# Patient Record
Sex: Male | Born: 1970
Health system: Southern US, Community
[De-identification: ages and names within clinical notes are randomized; demographics above are authoritative.]

## PROBLEM LIST (undated history)

## (undated) DIAGNOSIS — F419 Anxiety disorder, unspecified: Secondary | ICD-10-CM

## (undated) DIAGNOSIS — I10 Essential (primary) hypertension: Secondary | ICD-10-CM

## (undated) DIAGNOSIS — F32A Depression, unspecified: Secondary | ICD-10-CM

## (undated) DIAGNOSIS — F329 Major depressive disorder, single episode, unspecified: Secondary | ICD-10-CM

## (undated) DIAGNOSIS — Z85828 Personal history of other malignant neoplasm of skin: Secondary | ICD-10-CM

## (undated) DIAGNOSIS — F8081 Childhood onset fluency disorder: Secondary | ICD-10-CM

## (undated) DIAGNOSIS — R011 Cardiac murmur, unspecified: Secondary | ICD-10-CM

## (undated) DIAGNOSIS — F319 Bipolar disorder, unspecified: Secondary | ICD-10-CM

## (undated) HISTORY — DX: Anxiety disorder, unspecified: F41.9

## (undated) HISTORY — PX: OTHER SURGICAL HISTORY: SHX169

## (undated) HISTORY — PX: HERNIA REPAIR: SHX51

## (undated) HISTORY — DX: Personal history of other malignant neoplasm of skin: Z85.828

## (undated) HISTORY — DX: Bipolar disorder, unspecified: F31.9

## (undated) HISTORY — DX: Cardiac murmur, unspecified: R01.1

## (undated) HISTORY — DX: Childhood onset fluency disorder: F80.81

## (undated) HISTORY — DX: Depression, unspecified: F32.A

## (undated) HISTORY — DX: Essential (primary) hypertension: I10

## (undated) HISTORY — PX: COLONOSCOPY: SHX174

## (undated) HISTORY — PX: WRIST FRACTURE SURGERY: SHX121

## (undated) HISTORY — DX: Major depressive disorder, single episode, unspecified: F32.9

---

## 1998-06-22 ENCOUNTER — Encounter: Admission: RE | Admit: 1998-06-22 | Discharge: 1998-09-20 | Payer: Self-pay | Admitting: Internal Medicine

## 2004-04-22 ENCOUNTER — Ambulatory Visit: Payer: Self-pay | Admitting: Internal Medicine

## 2004-04-23 ENCOUNTER — Ambulatory Visit: Payer: Self-pay | Admitting: Internal Medicine

## 2004-04-30 ENCOUNTER — Ambulatory Visit: Payer: Self-pay | Admitting: Internal Medicine

## 2004-05-03 ENCOUNTER — Ambulatory Visit: Payer: Self-pay

## 2004-09-10 ENCOUNTER — Ambulatory Visit: Payer: Self-pay | Admitting: Internal Medicine

## 2004-11-05 ENCOUNTER — Ambulatory Visit: Payer: Self-pay | Admitting: Internal Medicine

## 2005-04-05 ENCOUNTER — Ambulatory Visit: Payer: Self-pay | Admitting: Internal Medicine

## 2005-05-26 ENCOUNTER — Ambulatory Visit: Payer: Self-pay | Admitting: Internal Medicine

## 2005-09-02 ENCOUNTER — Ambulatory Visit: Payer: Self-pay | Admitting: Internal Medicine

## 2005-09-23 ENCOUNTER — Ambulatory Visit: Payer: Self-pay | Admitting: Internal Medicine

## 2005-11-24 ENCOUNTER — Ambulatory Visit: Payer: Self-pay | Admitting: Internal Medicine

## 2006-05-22 ENCOUNTER — Ambulatory Visit: Payer: Self-pay | Admitting: Internal Medicine

## 2006-11-20 ENCOUNTER — Ambulatory Visit: Payer: Self-pay | Admitting: Internal Medicine

## 2006-11-20 DIAGNOSIS — Z85828 Personal history of other malignant neoplasm of skin: Secondary | ICD-10-CM

## 2006-11-20 DIAGNOSIS — L570 Actinic keratosis: Secondary | ICD-10-CM | POA: Insufficient documentation

## 2006-11-20 DIAGNOSIS — F329 Major depressive disorder, single episode, unspecified: Secondary | ICD-10-CM

## 2007-03-27 ENCOUNTER — Telehealth: Payer: Self-pay | Admitting: *Deleted

## 2007-03-28 ENCOUNTER — Ambulatory Visit: Payer: Self-pay | Admitting: Internal Medicine

## 2007-04-03 ENCOUNTER — Ambulatory Visit: Payer: Self-pay | Admitting: Licensed Clinical Social Worker

## 2007-04-10 ENCOUNTER — Ambulatory Visit: Payer: Self-pay | Admitting: Licensed Clinical Social Worker

## 2007-04-16 ENCOUNTER — Ambulatory Visit: Payer: Self-pay | Admitting: Licensed Clinical Social Worker

## 2007-04-23 ENCOUNTER — Ambulatory Visit: Payer: Self-pay | Admitting: Licensed Clinical Social Worker

## 2007-05-08 ENCOUNTER — Ambulatory Visit: Payer: Self-pay | Admitting: Internal Medicine

## 2007-05-08 DIAGNOSIS — R0683 Snoring: Secondary | ICD-10-CM | POA: Insufficient documentation

## 2007-07-21 ENCOUNTER — Ambulatory Visit (HOSPITAL_COMMUNITY): Admission: RE | Admit: 2007-07-21 | Discharge: 2007-07-21 | Payer: Self-pay | Admitting: Family Medicine

## 2007-07-21 ENCOUNTER — Emergency Department (HOSPITAL_COMMUNITY): Admission: EM | Admit: 2007-07-21 | Discharge: 2007-07-22 | Payer: Self-pay | Admitting: Family Medicine

## 2007-10-04 ENCOUNTER — Ambulatory Visit: Payer: Self-pay | Admitting: Internal Medicine

## 2007-10-26 ENCOUNTER — Ambulatory Visit: Payer: Self-pay | Admitting: Internal Medicine

## 2007-10-26 DIAGNOSIS — T887XXA Unspecified adverse effect of drug or medicament, initial encounter: Secondary | ICD-10-CM | POA: Insufficient documentation

## 2007-11-19 ENCOUNTER — Encounter: Payer: Self-pay | Admitting: Internal Medicine

## 2007-11-19 ENCOUNTER — Ambulatory Visit: Payer: Self-pay | Admitting: Internal Medicine

## 2007-12-21 ENCOUNTER — Telehealth: Payer: Self-pay | Admitting: Internal Medicine

## 2008-01-13 ENCOUNTER — Emergency Department (HOSPITAL_COMMUNITY): Admission: EM | Admit: 2008-01-13 | Discharge: 2008-01-13 | Payer: Self-pay | Admitting: Family Medicine

## 2008-06-20 ENCOUNTER — Encounter: Payer: Self-pay | Admitting: Internal Medicine

## 2008-06-20 ENCOUNTER — Ambulatory Visit: Payer: Self-pay | Admitting: Internal Medicine

## 2008-07-29 ENCOUNTER — Telehealth: Payer: Self-pay | Admitting: Internal Medicine

## 2008-07-29 ENCOUNTER — Ambulatory Visit: Payer: Self-pay | Admitting: Internal Medicine

## 2008-07-29 DIAGNOSIS — L255 Unspecified contact dermatitis due to plants, except food: Secondary | ICD-10-CM

## 2008-08-19 ENCOUNTER — Telehealth: Payer: Self-pay | Admitting: Internal Medicine

## 2008-09-22 ENCOUNTER — Ambulatory Visit: Payer: Self-pay | Admitting: Internal Medicine

## 2008-09-22 DIAGNOSIS — M19049 Primary osteoarthritis, unspecified hand: Secondary | ICD-10-CM | POA: Insufficient documentation

## 2008-10-15 ENCOUNTER — Telehealth: Payer: Self-pay | Admitting: Internal Medicine

## 2008-12-12 ENCOUNTER — Ambulatory Visit: Payer: Self-pay | Admitting: Family Medicine

## 2008-12-31 ENCOUNTER — Ambulatory Visit: Payer: Self-pay | Admitting: Internal Medicine

## 2008-12-31 ENCOUNTER — Encounter (INDEPENDENT_AMBULATORY_CARE_PROVIDER_SITE_OTHER): Payer: Self-pay | Admitting: *Deleted

## 2009-04-17 ENCOUNTER — Telehealth: Payer: Self-pay | Admitting: Internal Medicine

## 2009-05-21 ENCOUNTER — Ambulatory Visit: Payer: Self-pay | Admitting: Family Medicine

## 2009-05-21 DIAGNOSIS — J029 Acute pharyngitis, unspecified: Secondary | ICD-10-CM

## 2009-05-21 LAB — CONVERTED CEMR LAB: Rapid Strep: POSITIVE

## 2009-07-13 ENCOUNTER — Ambulatory Visit: Payer: Self-pay | Admitting: Internal Medicine

## 2009-08-10 ENCOUNTER — Ambulatory Visit: Payer: Self-pay | Admitting: Internal Medicine

## 2009-08-24 DIAGNOSIS — F985 Adult onset fluency disorder: Secondary | ICD-10-CM

## 2009-09-03 ENCOUNTER — Ambulatory Visit: Payer: Self-pay | Admitting: Internal Medicine

## 2009-09-03 LAB — CONVERTED CEMR LAB
ALT: 23 units/L (ref 0–53)
AST: 23 units/L (ref 0–37)
Albumin: 4 g/dL (ref 3.5–5.2)
Alkaline Phosphatase: 57 units/L (ref 39–117)
BUN: 11 mg/dL (ref 6–23)
Basophils Absolute: 0.1 10*3/uL (ref 0.0–0.1)
Basophils Relative: 1.2 % (ref 0.0–3.0)
Bilirubin Urine: NEGATIVE
Bilirubin, Direct: 0.2 mg/dL (ref 0.0–0.3)
Blood in Urine, dipstick: NEGATIVE
CO2: 30 meq/L (ref 19–32)
Calcium: 9.3 mg/dL (ref 8.4–10.5)
Chloride: 103 meq/L (ref 96–112)
Cholesterol: 149 mg/dL (ref 0–200)
Creatinine, Ser: 1 mg/dL (ref 0.4–1.5)
Eosinophils Absolute: 0.2 10*3/uL (ref 0.0–0.7)
Eosinophils Relative: 4.2 % (ref 0.0–5.0)
GFR calc non Af Amer: 88.25 mL/min (ref >60)
Glucose, Bld: 85 mg/dL (ref 70–99)
Glucose, Urine, Semiquant: NEGATIVE
HCT: 41.3 % (ref 39.0–52.0)
HDL: 35 mg/dL — ABNORMAL LOW (ref >39.00)
Hemoglobin: 14 g/dL (ref 13.0–17.0)
Ketones, urine, test strip: NEGATIVE
LDL Cholesterol: 95 mg/dL (ref 0–99)
Lymphocytes Relative: 34.9 % (ref 12.0–46.0)
Lymphs Abs: 1.8 10*3/uL (ref 0.7–4.0)
MCHC: 34 g/dL (ref 30.0–36.0)
MCV: 90.5 fL (ref 78.0–100.0)
Monocytes Absolute: 0.4 10*3/uL (ref 0.1–1.0)
Monocytes Relative: 7.9 % (ref 3.0–12.0)
Neutro Abs: 2.7 10*3/uL (ref 1.4–7.7)
Neutrophils Relative %: 51.8 % (ref 43.0–77.0)
Nitrite: NEGATIVE
Platelets: 173 10*3/uL (ref 150.0–400.0)
Potassium: 4.5 meq/L (ref 3.5–5.1)
RBC: 4.56 M/uL (ref 4.22–5.81)
RDW: 13 % (ref 11.5–14.6)
Sodium: 142 meq/L (ref 135–145)
Specific Gravity, Urine: >=1.03
TSH: 1.18 microintl units/mL (ref 0.35–5.50)
Total Bilirubin: 0.7 mg/dL (ref 0.3–1.2)
Total CHOL/HDL Ratio: 4
Total Protein: 7 g/dL (ref 6.0–8.3)
Triglycerides: 94 mg/dL (ref 0.0–149.0)
Urobilinogen, UA: 0.2
VLDL: 18.8 mg/dL (ref 0.0–40.0)
WBC Urine, dipstick: NEGATIVE
WBC: 5.2 10*3/uL (ref 4.5–10.5)
pH: 5.5

## 2009-09-14 ENCOUNTER — Ambulatory Visit: Payer: Self-pay | Admitting: Internal Medicine

## 2009-12-17 ENCOUNTER — Telehealth: Payer: Self-pay | Admitting: Internal Medicine

## 2010-01-25 ENCOUNTER — Telehealth: Payer: Self-pay | Admitting: Internal Medicine

## 2010-01-27 ENCOUNTER — Ambulatory Visit: Payer: Self-pay | Admitting: Internal Medicine

## 2010-01-27 DIAGNOSIS — R071 Chest pain on breathing: Secondary | ICD-10-CM

## 2010-03-15 ENCOUNTER — Other Ambulatory Visit: Payer: Self-pay | Admitting: Internal Medicine

## 2010-03-15 ENCOUNTER — Ambulatory Visit
Admission: RE | Admit: 2010-03-15 | Discharge: 2010-03-15 | Payer: Self-pay | Source: Home / Self Care | Attending: Internal Medicine | Admitting: Internal Medicine

## 2010-03-15 DIAGNOSIS — C44599 Other specified malignant neoplasm of skin of other part of trunk: Secondary | ICD-10-CM | POA: Insufficient documentation

## 2010-04-01 NOTE — Progress Notes (Signed)
Summary: samples please  Phone Note Call from Patient Call back at Home Phone [phone]   Caller: Patient Call For: Stacie Glaze MD Reason for Call: Insurance Question Summary of Call: pt needs pristiq samples Initial call taken by: Heron Sabins,  December 17, 2009 10:17 AM  Follow-up for Phone Call        left message on machine none avaiable- sent to [facility] Follow-up by: Willy Eddy, LPN,  December 17, 2009 11:38 AM    Prescriptions: PRISTIQ 100 MG XR24H-TAB (DESVENLAFAXINE SUCCINATE) one by mouth daily  #30 Each x 5   Entered by:   Willy Eddy, LPN   Authorized by:   Stacie Glaze MD   Signed by:   Willy Eddy, LPN on 09/81/1914   Method used:   Electronically to        [facility]  [phone]* (retail)       [address]       [place], [place]  [address]       Ph: [phone] or [phone]       Fax: [phone]   RxID:   [id number]

## 2010-04-01 NOTE — Assessment & Plan Note (Signed)
Summary: pt found lump in lft abdomen under ribs/per [place]/cjr   Vital Signs:  Patient profile:   40 year old male Height:      72 inches Weight:      218 pounds BMI:     29.67 Temp:     98.2 degrees F oral Pulse rate:   72 / minute Resp:     14 per minute BP sitting:   130 / 80  (left arm)  Vitals Entered By: Willy Eddy, LPN (January 27, 2010 3:54 PM) CC: c/o knot on left upper abd that pt states has increased in size- with frequent indigiestion- also c/o frequent ruptured blood vessels in eyes  Is Patient Diabetic? No   Primary Care Provider:  Stacie Glaze MD  CC:  c/o knot on left upper abd that pt states has increased in size- with frequent indigiestion- also c/o frequent ruptured blood vessels in eyes .  History of Present Illness: The pt has noted "heart burn" for several months For a week he has detected a knot in the area of the pain in the rib cage and the area is tender to touch The seroquil "knocks him out" has noted brocken blood vessel  in left eye  Preventive Screening-Counseling & Management  Alcohol-Tobacco     Smoking Status: never  Problems Prior to Update: 1)  Chest Wall Pain, Anterior  (ICD-786.52) 2)  Health Screening  (ICD-V70.0) 3)  Stuttering  (ICD-307.0) 4)  Sore Throat  (ICD-462) 5)  Osteoarthrosis Unspec Whether Gen/localized Hand  (ICD-715.94) 6)  Plant Dermatitis  (ICD-692.6) 7)  Uns Advrs Eff Uns Rx Medicinal&biological Sbstnc  (ICD-995.20) 8)  Neoplasm, Malignant, Skin, Trunk  (ICD-173.5) 9)  Snoring  (ICD-786.09) 10)  Actinic Keratosis, Forehead, Left  (ICD-702.0) 11)  Skin Cancer, Hx of  (ICD-V10.83) 12)  Depression  (ICD-311) 13)  Family History Diabetes 1st Degree Relative  (ICD-V18.0)  Current Problems (verified): 1)  Health Screening  (ICD-V70.0) 2)  Stuttering  (ICD-307.0) 3)  Sore Throat  (ICD-462) 4)  Osteoarthrosis Unspec Whether Gen/localized Hand  (ICD-715.94) 5)  Plant Dermatitis  (ICD-692.6) 6)  Uns Advrs  Eff Uns Rx Medicinal&biological Sbstnc  (ICD-995.20) 7)  Neoplasm, Malignant, Skin, Trunk  (ICD-173.5) 8)  Snoring  (ICD-786.09) 9)  Actinic Keratosis, Forehead, Left  (ICD-702.0) 10)  Skin Cancer, Hx of  (ICD-V10.83) 11)  Depression  (ICD-311) 12)  Family History Diabetes 1st Degree Relative  (ICD-V18.0)  Medications Prior to Update: 1)  Pristiq 100 Mg Xr24h-Tab (Desvenlafaxine Succinate) .... One By Mouth Daily 2)  Seroquel 25 Mg Tabs (Quetiapine Fumarate) .... One By Mouth Q Hs  Current Medications (verified): 1)  Pristiq 100 Mg Xr24h-Tab (Desvenlafaxine Succinate) .... One By Mouth Daily 2)  Mirtazapine 15 Mg Tabs (Mirtazapine) .... One By Mouth A Hs  Allergies (verified): No Known Drug Allergies  Past History:  Family History: Last updated: 11/20/2006 Family History High cholesterol Family History Hypertension hypercoagulable syndrome Family History Diabetes 1st degree relative  Social History: Last updated: 11/20/2006 Married Never Smoked  Risk Factors: Smoking Status: never (01/27/2010)  Past medical, surgical, family and social histories (including risk factors) reviewed, and no changes noted (except as noted below).  Past Medical History: Reviewed history from 11/20/2006 and no changes required. Depression Skin cancer, hx of  Past Surgical History: Reviewed history from 11/20/2006 and no changes required. mole bx fx to arm Inguinal herniorrhaphy as a child  Family History: Reviewed history from 11/20/2006 and no changes required. Family History  High cholesterol Family History Hypertension hypercoagulable syndrome Family History Diabetes 1st degree relative  Social History: Reviewed history from 11/20/2006 and no changes required. Married Never Smoked  Review of Systems       The patient complains of chest pain.  The patient denies anorexia, fever, weight loss, weight gain, vision loss, decreased hearing, hoarseness, syncope, dyspnea on exertion,  peripheral edema, prolonged cough, headaches, hemoptysis, abdominal pain, melena, hematochezia, severe indigestion/heartburn, hematuria, incontinence, genital sores, muscle weakness, suspicious skin lesions, transient blindness, difficulty walking, depression, unusual weight change, abnormal bleeding, enlarged lymph nodes, angioedema, and breast masses.    Physical Exam  General:  Well-developed,well-nourished,in no acute distress; alert,appropriate and cooperative throughout examination Head:  Normocephalic and atraumatic without obvious abnormalities. No apparent alopecia or balding. Ears:  R ear normal and L ear normal.   Nose:  no external deformity and no nasal discharge.   Mouth:  erythematous post pharynx without exudate. Neck:  minimal adenopathy. Lungs:  normal respiratory effort and no intercostal retractions.   Heart:  normal rate and regular rhythm.   Abdomen:  Bowel sounds positive,abdomen soft and non-tender without masses, organomegaly or hernias noted.   Impression & Recommendations:  Problem # 1:  CHEST WALL PAIN, ANTERIOR (ICD-786.52) point injection given Reviewed EKG (see interpretation) and treatment options. Patient instructed to call for worsening pain, or new symptoms.   Problem # 2:  UNS ADVRS EFF UNS RX MEDICINAL&BIOLOGICAL SBSTNC (ICD-995.20) effect of seroquil mandated stopping drug but still has anger issues  Problem # 3:  DEPRESSION (ICD-311)  His updated medication list for this problem includes:    Pristiq 100 Mg Xr24h-tab (Desvenlafaxine succinate) ..... One by mouth daily    Mirtazapine 15 Mg Tabs (Mirtazapine) ..... One by mouth a hs  Complete Medication List: 1)  Pristiq 100 Mg Xr24h-tab (Desvenlafaxine succinate) .... One by mouth daily 2)  Mirtazapine 15 Mg Tabs (Mirtazapine) .... One by mouth a hs  Patient Instructions: 1)  Please schedule a follow-up appointment in 3 months. Prescriptions: MIRTAZAPINE 15 MG TABS (MIRTAZAPINE) one by mouth a  HS  #30 x 3   Entered and Authorized by:   Stacie Glaze MD   Signed by:   Stacie Glaze MD on 01/27/2010   Method used:   Electronically to        [facility]  [phone]* (retail)       [address]       [place], [place]  [address]       Ph: [phone] or [phone]       Fax: [phone]   RxID:   [id number]    Orders Added: 1)  Est. Patient Level III [36644]

## 2010-04-01 NOTE — Assessment & Plan Note (Signed)
Summary: mole removal/cjr   Vital Signs:  Patient profile:   40 year old male Height:      72 inches Weight:      200 pounds BMI:     27.22 Temp:     98.6 degrees F oral Pulse rate:   72 / minute Resp:     14 per minute BP sitting:   110 / 70  (left arm)  Vitals Entered By: Willy Eddy, LPN (Jul 13, 2009 4:08 PM) CC: 6 month mole check   CC:  6 month mole check.  History of Present Illness: mole check and bx  Preventive Screening-Counseling & Management  Alcohol-Tobacco     Smoking Status: never  Current Problems (verified): 1)  Sore Throat  (ICD-462) 2)  Osteoarthrosis Unspec Whether Gen/localized Hand  (ICD-715.94) 3)  Plant Dermatitis  (ICD-692.6) 4)  Uns Advrs Eff Uns Rx Medicinal&biological Sbstnc  (ICD-995.20) 5)  Neoplasm, Malignant, Skin, Trunk  (ICD-173.5) 6)  Snoring  (ICD-786.09) 7)  Actinic Keratosis, Forehead, Left  (ICD-702.0) 8)  Skin Cancer, Hx of  (ICD-V10.83) 9)  Depression  (ICD-311) 10)  Family History Diabetes 1st Degree Relative  (ICD-V18.0)  Current Medications (verified): 1)  Pristiq 100 Mg Xr24h-Tab (Desvenlafaxine Succinate) .... One By Mouth Daily  Allergies (verified): No Known Drug Allergies  Physical Exam  Skin:  three leson on back consistant with prior neoplasm   Impression & Recommendations:  Problem # 1:  NEOPLASM, MALIGNANT, SKIN, TRUNK (ICD-173.5)  pt was preped in a sterile manor and informed consent obtained. Using a 15 blade three lesions were removed and sent for pathology. sterile dressings were applied  and wound care discussed with the pt. upper mid and lower back  Orders: No Charge Patient Arrived (NCPA0) (NCPA0) Excise Malig Lesion (TAL) 0 - 0.5 cm (11600)  Problem # 2:  DEPRESSION (ICD-311)  with anxiety and anger issues add seroguil 25 myg by mouth q hs His updated medication list for this problem includes:    Pristiq 100 Mg Xr24h-tab (Desvenlafaxine succinate) ..... One by mouth  daily  Discussed treatment options, including trial of antidpressant medication. Will refer to behavioral health. Follow-up call in in 24-48 hours and recheck in 2 weeks, sooner as needed. Patient agrees to call if any worsening of symptoms or thoughts of doing harm arise. Verified that the patient has no suicidal ideation at this time.   Complete Medication List: 1)  Pristiq 100 Mg Xr24h-tab (Desvenlafaxine succinate) .... One by mouth daily 2)  Seroquel 25 Mg Tabs (Quetiapine fumarate) .... One by mouth q hs  Patient Instructions: 1)  Please schedule a follow-up appointment in 1 month. Prescriptions: SEROQUEL 25 MG TABS (QUETIAPINE FUMARATE) one by mouth q HS  #30 x 6   Entered and Authorized by:   Stacie Glaze MD   Signed by:   Stacie Glaze MD on 07/13/2009   Method used:   Electronically to        [facility]  [phone]* (retail)       [address]       [place], [place]  [address]       Ph: [phone] or [phone]       Fax: [phone]   RxID:   [id number]

## 2010-04-01 NOTE — Progress Notes (Signed)
Summary: samples  Phone Note Call from Patient   Caller: Patient Call For: Stacie Glaze MD Reason for Call: Acute Illness Complaint: Urinary/GYN Problems Summary of Call: calling for samples of pristiq [phone]- [phone] Initial call taken by: Raechel Ache, RN,  April 17, 2009 1:48 PM  Follow-up for Phone Call        samples out front-left message on machine  Follow-up by: Willy Eddy, LPN,  April 17, 2009 4:50 PM

## 2010-04-01 NOTE — Progress Notes (Signed)
Summary: Pt req work in appt with Dr Lovell Sheehan only this week  Phone Note Call from Patient Call back at [facility] Phone [phone]   Caller: Patient Reason for Call: Acute Illness Summary of Call: Pt found a lump lft side up under pts ribs. Pt said that it is tender to touch. Pt req work in appt asap with Dr. Lovell Sheehan only, for sometime this week.  Initial call taken by: Lucy Antigua,  January 25, 2010 11:38 AM  Follow-up for Phone Call        may have the 3:30 block on wednesday- thanks-please call pt Follow-up by: Willy Eddy, LPN,  January 25, 2010 11:49 AM  Additional Follow-up for Phone Call Additional follow up Details #1::        I called pt and sch ov for 3:30pm on Wed 01/27/10, as noted above.  Additional Follow-up by: Lucy Antigua,  January 25, 2010 2:21 PM

## 2010-04-01 NOTE — Assessment & Plan Note (Signed)
Summary: SORE THROAT (CONCERNED ABOUT STREP) // RS   Vital Signs:  Patient profile:   40 year old male Temp:     98.7 degrees F oral BP sitting:   110 / 80  (left arm) Cuff size:   regular  Vitals Entered By: Sid Falcon LPN (May 21, 2009 4:39 PM)  History of Present Illness: Sore throat for 4-5 days.  One of his children had strep last week. ?low grade fever monday but none since. Denies nasal cong, current fever, cough.  Pos body aches and intermittent mild headaches. No alleviating factors.  No exacerbating factors.  Also mildly pruritic rash L forearm and hand.   NKDA.  Allergies (verified): No Known Drug Allergies  Past History:  Past Medical History: Last updated: 11/20/2006 Depression Skin cancer, hx of PMH reviewed for relevance  Review of Systems      See HPI  Physical Exam  General:  Well-developed,well-nourished,in no acute distress; alert,appropriate and cooperative throughout examination Head:  Normocephalic and atraumatic without obvious abnormalities. No apparent alopecia or balding. Ears:  External ear exam shows no significant lesions or deformities.  Otoscopic examination reveals clear canals, tympanic membranes are intact bilaterally without bulging, retraction, inflammation or discharge. Hearing is grossly normal bilaterally. Mouth:  erythematous post pharynx without exudate. Neck:  minimal adenopathy. Lungs:  Normal respiratory effort, chest expands symmetrically. Lungs are clear to auscultation, no crackles or wheezes. Heart:  Normal rate and regular rhythm. S1 and S2 normal without gallop, murmur, click, rub or other extra sounds. Skin:  slightly raised rash L forearm with mild erythema/vesicular   Impression & Recommendations:  Problem # 1:  SORE THROAT (ICD-462) strep pos.  Start Amoxicillin. The following medications were removed from the medication list:    Amoxicillin-pot Clavulanate 875-125 Mg Tabs (Amoxicillin-pot clavulanate)  ..... One by mouth two times a day for 10 days His updated medication list for this problem includes:    Amoxicillin 875 Mg Tabs (Amoxicillin) ..... One by mouth two times a day for 10 days  Orders: Rapid Strep ([address])  Problem # 2:  PLANT DERMATITIS (ICD-692.6) mild case. pt will use topicals and try to avoid prednisone at this time with concommitent  bacterial illness.  Complete Medication List: 1)  Pristiq 100 Mg Xr24h-tab (Desvenlafaxine succinate) .... One by mouth daily 2)  Amoxicillin 875 Mg Tabs (Amoxicillin) .... One by mouth two times a day for 10 days  Patient Instructions: 1)  Tylenol or Advil for symptom relief of sore throat. Prescriptions: AMOXICILLIN 875 MG TABS (AMOXICILLIN) one by mouth two times a day for 10 days  #20 x 0   Entered and Authorized by:   Evelena Peat MD   Signed by:   Evelena Peat MD on 05/21/2009   Method used:   Print then Give to Patient   RxID:   [id number]   Laboratory Results  Date/Time Received: May 21, 2009 4:48 PM  Date/Time Reported: May 21, 2009 4:48 PM   Other Tests  Rapid Strep: positive Comments Wynona Canes, CMA  May 21, 2009 4:49 PM

## 2010-04-01 NOTE — Assessment & Plan Note (Signed)
Summary: cpx/cjr   Vital Signs:  Patient profile:   40 year old male Height:      72 inches Weight:      216 pounds BMI:     29.40 Temp:     98.2 degrees F oral Pulse rate:   72 / minute Resp:     14 per minute BP sitting:   124 / 80  (left arm)  Vitals Entered By: Willy Eddy, LPN (September 14, 2009 3:17 PM)  Nutrition Counseling: Patient's BMI is greater than 25 and therefore counseled on weight management options. CC: cpx Is Patient Diabetic? No Pain Assessment Patient in pain? no        CC:  cpx.  History of Present Illness: The pt was asked about all immunizations, health maint. services that are appropriate to their age and was given guidance on diet exercize  and weight management   Preventive Screening-Counseling & Management  Alcohol-Tobacco     Smoking Status: never  Problems Prior to Update: 1)  Stuttering  (ICD-307.0) 2)  Sore Throat  (ICD-462) 3)  Osteoarthrosis Unspec Whether Gen/localized Hand  (ICD-715.94) 4)  Plant Dermatitis  (ICD-692.6) 5)  Uns Advrs Eff Uns Rx Medicinal&biological Sbstnc  (ICD-995.20) 6)  Neoplasm, Malignant, Skin, Trunk  (ICD-173.5) 7)  Snoring  (ICD-786.09) 8)  Actinic Keratosis, Forehead, Left  (ICD-702.0) 9)  Skin Cancer, Hx of  (ICD-V10.83) 10)  Depression  (ICD-311) 11)  Family History Diabetes 1st Degree Relative  (ICD-V18.0)  Current Problems (verified): 1)  Stuttering  (ICD-307.0) 2)  Sore Throat  (ICD-462) 3)  Osteoarthrosis Unspec Whether Gen/localized Hand  (ICD-715.94) 4)  Plant Dermatitis  (ICD-692.6) 5)  Uns Advrs Eff Uns Rx Medicinal&biological Sbstnc  (ICD-995.20) 6)  Neoplasm, Malignant, Skin, Trunk  (ICD-173.5) 7)  Snoring  (ICD-786.09) 8)  Actinic Keratosis, Forehead, Left  (ICD-702.0) 9)  Skin Cancer, Hx of  (ICD-V10.83) 10)  Depression  (ICD-311) 11)  Family History Diabetes 1st Degree Relative  (ICD-V18.0)  Medications Prior to Update: 1)  Pristiq 100 Mg Xr24h-Tab (Desvenlafaxine Succinate)  .... One By Mouth Daily 2)  Seroquel 25 Mg Tabs (Quetiapine Fumarate) .... One By Mouth Q Hs  Current Medications (verified): 1)  Pristiq 100 Mg Xr24h-Tab (Desvenlafaxine Succinate) .... One By Mouth Daily 2)  Seroquel 25 Mg Tabs (Quetiapine Fumarate) .... One By Mouth Q Hs  Allergies (verified): No Known Drug Allergies  Past History:  Family History: Last updated: 11/20/2006 Family History High cholesterol Family History Hypertension hypercoagulable syndrome Family History Diabetes 1st degree relative  Social History: Last updated: 11/20/2006 Married Never Smoked  Risk Factors: Smoking Status: never (09/14/2009)  Past medical, surgical, family and social histories (including risk factors) reviewed, and no changes noted (except as noted below).  Past Medical History: Reviewed history from 11/20/2006 and no changes required. Depression Skin cancer, hx of  Past Surgical History: Reviewed history from 11/20/2006 and no changes required. mole bx fx to arm Inguinal herniorrhaphy as a child  Family History: Reviewed history from 11/20/2006 and no changes required. Family History High cholesterol Family History Hypertension hypercoagulable syndrome Family History Diabetes 1st degree relative  Social History: Reviewed history from 11/20/2006 and no changes required. Married Never Smoked  Review of Systems  The patient denies anorexia, fever, weight loss, weight gain, vision loss, decreased hearing, hoarseness, chest pain, syncope, dyspnea on exertion, peripheral edema, prolonged cough, headaches, hemoptysis, abdominal pain, melena, hematochezia, severe indigestion/heartburn, hematuria, incontinence, genital sores, muscle weakness, suspicious skin lesions, transient blindness, difficulty walking,  depression, unusual weight change, abnormal bleeding, enlarged lymph nodes, angioedema, and breast masses.    Physical Exam  General:  Well-developed,well-nourished,in no  acute distress; alert,appropriate and cooperative throughout examination Head:  Normocephalic and atraumatic without obvious abnormalities. No apparent alopecia or balding. Ears:  R ear normal and L ear normal.   Nose:  no external deformity and no nasal discharge.   Neck:  minimal adenopathy. Chest Wall:  No deformities, masses, tenderness or gynecomastia noted. Breasts:  No masses or gynecomastia noted Lungs:  normal respiratory effort and no intercostal retractions.   Heart:  normal rate and regular rhythm.   Abdomen:  Bowel sounds positive,abdomen soft and non-tender without masses, organomegaly or hernias noted. Rectal:  no external abnormalities and no masses.   Genitalia:  uncircumcised and no urethral discharge.   Prostate:  no gland enlargement and no nodules.     Impression & Recommendations:  Problem # 1:  HEALTH SCREENING (ICD-V70.0)  Orders: normal  EKG w/ Interpretation (93000)  Td Booster: Tdap (11/20/2006)   Chol: 149 (09/03/2009)   HDL: 35.00 (09/03/2009)   LDL: 95 (09/03/2009)   TG: 94.0 (09/03/2009) TSH: 1.18 (09/03/2009)    Discussed using sunscreen, use of alcohol, drug use, self testicular exam, routine dental care, routine eye care, routine physical exam, seat belts, multiple vitamins, osteoporosis prevention, adequate calcium intake in diet, and recommendations for immunizations.  Discussed exercise and checking cholesterol.  Discussed gun safety, safe sex, and contraception. Also recommend checking PSA.  Complete Medication List: 1)  Pristiq 100 Mg Xr24h-tab (Desvenlafaxine succinate) .... One by mouth daily 2)  Seroquel 25 Mg Tabs (Quetiapine fumarate) .... One by mouth q hs  Patient Instructions: 1)  Please schedule a follow-up appointment in 6 months. mole check

## 2010-04-01 NOTE — Assessment & Plan Note (Signed)
Summary: 1 month rov/njr   Vital Signs:  Patient profile:   40 year old male Height:      72 inches Weight:      216 pounds BMI:     29.40 Temp:     98.2 degrees F oral Pulse rate:   72 / minute Resp:     14 per minute BP sitting:   110 / 70  (left arm)  Vitals Entered By: Willy Eddy, LPN (August 10, 2009 4:02 PM)              CC: roa-seroquel makes him "groggy"   CC:  roa-seroquel makes him "groggy".  History of Present Illness: the seroguil helps but it makes him "groggy" the medication is helping him sleep    so we recommended to take closer to bed time control of the anger is better mood aroung kids is  is in school studdering is stable  Preventive Screening-Counseling & Management  Alcohol-Tobacco     Smoking Status: never  Problems Prior to Update: 1)  Sore Throat  (ICD-462) 2)  Osteoarthrosis Unspec Whether Gen/localized Hand  (ICD-715.94) 3)  Plant Dermatitis  (ICD-692.6) 4)  Uns Advrs Eff Uns Rx Medicinal&biological Sbstnc  (ICD-995.20) 5)  Neoplasm, Malignant, Skin, Trunk  (ICD-173.5) 6)  Snoring  (ICD-786.09) 7)  Actinic Keratosis, Forehead, Left  (ICD-702.0) 8)  Skin Cancer, Hx of  (ICD-V10.83) 9)  Depression  (ICD-311) 10)  Family History Diabetes 1st Degree Relative  (ICD-V18.0)  Current Problems (verified): 1)  Sore Throat  (ICD-462) 2)  Osteoarthrosis Unspec Whether Gen/localized Hand  (ICD-715.94) 3)  Plant Dermatitis  (ICD-692.6) 4)  Uns Advrs Eff Uns Rx Medicinal&biological Sbstnc  (ICD-995.20) 5)  Neoplasm, Malignant, Skin, Trunk  (ICD-173.5) 6)  Snoring  (ICD-786.09) 7)  Actinic Keratosis, Forehead, Left  (ICD-702.0) 8)  Skin Cancer, Hx of  (ICD-V10.83) 9)  Depression  (ICD-311) 10)  Family History Diabetes 1st Degree Relative  (ICD-V18.0)  Medications Prior to Update: 1)  Pristiq 100 Mg Xr24h-Tab (Desvenlafaxine Succinate) .... One By Mouth Daily 2)  Seroquel 25 Mg Tabs (Quetiapine Fumarate) .... One By Mouth Q  Hs  Current Medications (verified): 1)  Pristiq 100 Mg Xr24h-Tab (Desvenlafaxine Succinate) .... One By Mouth Daily 2)  Seroquel 25 Mg Tabs (Quetiapine Fumarate) .... One By Mouth Q Hs  Allergies (verified): No Known Drug Allergies  Past History:  Family History: Last updated: 11/20/2006 Family History High cholesterol Family History Hypertension hypercoagulable syndrome Family History Diabetes 1st degree relative  Social History: Last updated: 11/20/2006 Married Never Smoked  Risk Factors: Smoking Status: never (08/10/2009)  Past medical, surgical, family and social histories (including risk factors) reviewed, and no changes noted (except as noted below).  Past Medical History: Reviewed history from 11/20/2006 and no changes required. Depression Skin cancer, hx of  Past Surgical History: Reviewed history from 11/20/2006 and no changes required. mole bx fx to arm Inguinal herniorrhaphy as a child  Family History: Reviewed history from 11/20/2006 and no changes required. Family History High cholesterol Family History Hypertension hypercoagulable syndrome Family History Diabetes 1st degree relative  Social History: Reviewed history from 11/20/2006 and no changes required. Married Never Smoked  Review of Systems  The patient denies anorexia, fever, weight loss, weight gain, vision loss, decreased hearing, hoarseness, chest pain, syncope, dyspnea on exertion, peripheral edema, prolonged cough, headaches, hemoptysis, abdominal pain, melena, hematochezia, severe indigestion/heartburn, hematuria, incontinence, genital sores, muscle weakness, suspicious skin lesions, transient blindness, difficulty walking, depression, unusual weight change, abnormal  bleeding, enlarged lymph nodes, angioedema, breast masses, and testicular masses.    Physical Exam  General:  Well-developed,well-nourished,in no acute distress; alert,appropriate and cooperative throughout  examination Head:  Normocephalic and atraumatic without obvious abnormalities. No apparent alopecia or balding. Ears:  R ear normal and L ear normal.   Nose:  no external deformity and no nasal discharge.   Neck:  minimal adenopathy. Lungs:  normal respiratory effort and no intercostal retractions.   Heart:  normal rate and regular rhythm.   Abdomen:  Bowel sounds positive,abdomen soft and non-tender without masses, organomegaly or hernias noted. Msk:  joint swelling.  cyst in hand Neurologic:  alert & oriented X3.   Psych:  good eye contact, not anxious appearing, and subdued.     Impression & Recommendations:  Problem # 1:  DEPRESSION (ICD-311)  His updated medication list for this problem includes:    Pristiq 100 Mg Xr24h-tab (Desvenlafaxine succinate) ..... One by mouth daily  Discussed treatment options, including trial of antidpressant medication. Will refer to behavioral health. Follow-up call in in 24-48 hours and recheck in 2 weeks, sooner as needed. Patient agrees to call if any worsening of symptoms or thoughts of doing harm arise. Verified that the patient has no suicidal ideation at this time.   Problem # 2:  UNS ADVRS EFF UNS RX MEDICINAL&BIOLOGICAL SBSTNC (ICD-995.20) stable reaction to the seroquil  Problem # 3:  STUTTERING (ICD-307.0) working with speech therapy and control of anxiety  Complete Medication List: 1)  Pristiq 100 Mg Xr24h-tab (Desvenlafaxine succinate) .... One by mouth daily 2)  Seroquel 25 Mg Tabs (Quetiapine fumarate) .... One by mouth q hs  Patient Instructions: 1)  Please schedule a follow-up appointment in 3 months.

## 2010-04-07 NOTE — Assessment & Plan Note (Signed)
Summary: mole check/njr   Vital Signs:  Patient profile:   40 year old male Height:      72 inches Weight:      218 pounds BMI:     29.67 Temp:     98.1 degrees F oral Pulse rate:   72 / minute Resp:     14 per minute BP sitting:   130 / 80  (left arm)  Vitals Entered By: Willy Eddy, LPN (March 15, 2010 2:48 PM) CC: 6 month mole check Is Patient Diabetic? No   Primary Care Provider:  Stacie Glaze MD  CC:  6 month mole check.  History of Present Illness: skin check and two  moles found on prior physical examination one a site of prior premalignamt lesions on his low back   Preventive Screening-Counseling & Management  Alcohol-Tobacco     Smoking Status: never  Current Problems (verified): 1)  Chest Wall Pain, Anterior  (ICD-786.52) 2)  Health Screening  (ICD-V70.0) 3)  Stuttering  (ICD-307.0) 4)  Sore Throat  (ICD-462) 5)  Osteoarthrosis Unspec Whether Gen/localized Hand  (ICD-715.94) 6)  Plant Dermatitis  (ICD-692.6) 7)  Uns Advrs Eff Uns Rx Medicinal&biological Sbstnc  (ICD-995.20) 8)  Neoplasm, Malignant, Skin, Trunk  (ICD-173.5) 9)  Snoring  (ICD-786.09) 10)  Actinic Keratosis, Forehead, Left  (ICD-702.0) 11)  Skin Cancer, Hx of  (ICD-V10.83) 12)  Depression  (ICD-311) 13)  Family History Diabetes 1st Degree Relative  (ICD-V18.0)  Current Medications (verified): 1)  Pristiq 100 Mg Xr24h-Tab (Desvenlafaxine Succinate) .... One By Mouth Daily 2)  Mirtazapine 15 Mg Tabs (Mirtazapine) .... One By Mouth A Hs  Allergies (verified): No Known Drug Allergies   Impression & Recommendations:  Problem # 1:  OTH SPEC MALIG NEOPLASM SKIN TRUNK NO SCROTUM (ICD-173.59)  2 mol were identified on his low back and one area of previous excision was noted to have repeat pigmented growth on the mid low back all 3 lesions were removed using  the procedure  below pt was prepped in a sterile manor and informed consent obtained. Using a 15 blade the lesion was removed  and sent for pathology. sterile dressings were applied  and wound care discussed with the pt.  Orders: No Charge Patient Arrived (NCPA0) (NCPA0) Excise lesion (TAL) 0 - 0.5 cm (11400)  Complete Medication List: 1)  Pristiq 100 Mg Xr24h-tab (Desvenlafaxine succinate) .... One by mouth daily 2)  Mirtazapine 15 Mg Tabs (Mirtazapine) .... One by mouth a hs  Patient Instructions: 1)  Please schedule a follow-up appointment in 3 months.   Orders Added: 1)  No Charge Patient Arrived (NCPA0) [NCPA0] 2)  Excise lesion (TAL) 0 - 0.5 cm [11400]

## 2010-06-10 ENCOUNTER — Encounter: Payer: Self-pay | Admitting: Internal Medicine

## 2010-06-18 ENCOUNTER — Ambulatory Visit: Payer: Self-pay | Admitting: Internal Medicine

## 2010-07-05 ENCOUNTER — Telehealth: Payer: Self-pay | Admitting: *Deleted

## 2010-07-05 NOTE — Telephone Encounter (Signed)
Pt only wants to speak to Dr. Lovell Sheehan. LMTCB

## 2010-07-05 NOTE — Telephone Encounter (Signed)
Per dr Temple Pacini to go to d r Emerson Monte for testing- pt informed and name and telephone number given

## 2010-07-05 NOTE — Telephone Encounter (Signed)
Pt thinks he is bipolar.  Would like to talk to Dr. Lovell Sheehan or have an appt.  The Pristiq and Remeron does not seem to be working.

## 2010-07-15 ENCOUNTER — Telehealth: Payer: Self-pay | Admitting: *Deleted

## 2010-07-15 NOTE — Telephone Encounter (Signed)
Pt is asking for Pristique samples.

## 2010-07-19 NOTE — Telephone Encounter (Signed)
Ready for pick up

## 2010-07-20 NOTE — Telephone Encounter (Signed)
Left message samples are ready for pick up.

## 2010-08-09 ENCOUNTER — Telehealth: Payer: Self-pay | Admitting: *Deleted

## 2010-08-09 MED ORDER — PREDNISONE (PAK) 10 MG PO TABS: 10.0000 mg | ORAL_TABLET | Freq: Every day | ORAL | Status: AC

## 2010-08-09 NOTE — Telephone Encounter (Signed)
Meds sent in to pharmacy per previous notes that pt has poison ivy and Dr. Lovell Sheehan sent meds to pharmacy.  See previous note

## 2010-08-09 NOTE — Telephone Encounter (Signed)
Per dr Lovell Sheehan- may have prednisone 10 mg 12 day dose pack

## 2010-08-09 NOTE — Telephone Encounter (Signed)
Needs meds for poison ivy.

## 2010-09-17 ENCOUNTER — Encounter: Payer: Self-pay | Admitting: Internal Medicine

## 2010-09-17 ENCOUNTER — Ambulatory Visit (INDEPENDENT_AMBULATORY_CARE_PROVIDER_SITE_OTHER): Payer: 59 | Admitting: Internal Medicine

## 2010-09-17 VITALS — BP 120/80 | Temp 98.4°F | Wt 227.0 lb

## 2010-09-17 DIAGNOSIS — M545 Low back pain, unspecified: Secondary | ICD-10-CM

## 2010-09-17 MED ORDER — TRAMADOL HCL 50 MG PO TABS: 50.0000 mg | ORAL_TABLET | Freq: Four times a day (QID) | ORAL | Status: DC | PRN

## 2010-09-17 MED ORDER — METHYLPREDNISOLONE ACETATE 80 MG/ML IJ SUSP
80.0000 mg | Freq: Once | INTRAMUSCULAR | Status: AC
  Administered 2010-09-17: 80 mg via INTRAMUSCULAR

## 2010-09-17 NOTE — Progress Notes (Signed)
  Subjective:    Patient ID: Caleb Clayton, male    DOB: 12-01-1970, 40 y.o.   MRN: [id number]  HPI  40 year old patient who is seen today with a five-day history of pain in the lumbar back region. He states that he has had similar pain in 2009. Pain is described as a spasm. There may be some slight radiation to the right upper outer leg. No motor weakness. Denies any constitutional complaints. He also complains of a mild sore throat    Review of Systems  Musculoskeletal: Positive for back pain.       Objective:   Physical Exam  Constitutional: He appears well-developed and well-nourished. No distress.  HENT:  Mouth/Throat: Oropharynx is clear and moist.  Musculoskeletal: Normal range of motion.       Negative straight leg test No motor weakness Achilles reflexes normal          Assessment & Plan:   Low back pain. We'll treat with Depo-Medrol and clinically observed;  we'll give a prescription for tramadol as needed

## 2010-09-17 NOTE — Patient Instructions (Signed)
Most patients with low back pain will improve with time over the next two to 6 weeks.  Keep active but avoid any activities that cause pain.  Apply moist heat to the low back area several times daily.  Take 400-600 mg of ibuprofen ( Advil, Motrin) with food every 4 to 6 hours as needed for pain relief or control of fever

## 2010-10-01 ENCOUNTER — Ambulatory Visit (INDEPENDENT_AMBULATORY_CARE_PROVIDER_SITE_OTHER): Payer: 59 | Admitting: Internal Medicine

## 2010-10-01 VITALS — BP 130/80 | HR 72 | Temp 98.2°F | Resp 16 | Ht 72.0 in | Wt 224.0 lb

## 2010-10-01 DIAGNOSIS — D239 Other benign neoplasm of skin, unspecified: Secondary | ICD-10-CM

## 2010-10-01 DIAGNOSIS — D229 Melanocytic nevi, unspecified: Secondary | ICD-10-CM

## 2010-10-01 MED ORDER — PHENTERMINE HCL 37.5 MG PO CAPS: 37.5000 mg | ORAL_CAPSULE | ORAL | Status: DC

## 2010-10-01 MED ORDER — PHENTERMINE HCL 37.5 MG PO CAPS: 37.5000 mg | ORAL_CAPSULE | ORAL | Status: AC

## 2010-10-01 NOTE — Patient Instructions (Signed)
Take Prilosec over-the-counter one daily until you see me in 2 months I have given you a prescription for Adipex-P 37.5 mg to take first thing in the morning to suppress her appetite all day

## 2010-11-02 ENCOUNTER — Encounter: Payer: Self-pay | Admitting: Internal Medicine

## 2010-11-02 NOTE — Progress Notes (Signed)
  Subjective:    Patient ID: Caleb Clayton, male    DOB: 04/06/1970, 40 y.o.   MRN: [id number]  HPI This is a [age]-year-old white male presents for multiple medical problems including depression history of actinic keratosis and history of precancerous lesions which she is monitoring of his skin every 6 months and multiple biopsies.  He denies any new lesions that he has detected a presents for a complete skin examination.     Review of Systems  Constitutional: Negative for fever and fatigue.  HENT: Negative for hearing loss, congestion, neck pain and postnasal drip.   Eyes: Negative for discharge, redness and visual disturbance.  Respiratory: Negative for cough, shortness of breath and wheezing.   Cardiovascular: Negative for leg swelling.  Gastrointestinal: Negative for abdominal pain, constipation and abdominal distention.  Genitourinary: Negative for urgency and frequency.  Musculoskeletal: Negative for joint swelling and arthralgias.  Skin: Negative for color change and rash.  Neurological: Negative for weakness and light-headedness.  Hematological: Negative for adenopathy.  Psychiatric/Behavioral: Negative for behavioral problems.       Objective:   Physical Exam  Nursing note and vitals reviewed. Constitutional: He appears well-developed and well-nourished.  HENT:  Head: Normocephalic and atraumatic.  Eyes: Conjunctivae are normal. Pupils are equal, round, and reactive to light.  Neck: Normal range of motion. Neck supple.  Cardiovascular: Normal rate and regular rhythm.   Pulmonary/Chest: Effort normal and breath sounds normal.  Abdominal: Soft. Bowel sounds are normal.  Skin:       2 lesions identified that are suspicious for neoplasm          Assessment & Plan:  Patient was prepped in a sterile manner and using shave biopsy technique 2 lesions were removed and the patient sent for pathology.  Subsequent pathology revealed the lesions to be dysplastic but the  margins were free he should have continued 6 month surveillance

## 2010-11-12 ENCOUNTER — Telehealth: Payer: Self-pay | Admitting: *Deleted

## 2010-11-12 MED ORDER — HYDROCORTISONE 2.5 % RE CREA: TOPICAL_CREAM | RECTAL | Status: DC

## 2010-11-12 NOTE — Telephone Encounter (Signed)
Spoke with pt - informed of rx sent to pharmacy and other instructions

## 2010-11-12 NOTE — Telephone Encounter (Signed)
Please call in a prescription for Anusol-HC cream;  suggest high fiber diet liberal fluid intake and a stool softener

## 2010-11-12 NOTE — Telephone Encounter (Signed)
Pt is complaining of hemorrhoids and constipation x one week.

## 2010-12-01 ENCOUNTER — Ambulatory Visit (INDEPENDENT_AMBULATORY_CARE_PROVIDER_SITE_OTHER): Payer: 59 | Admitting: Internal Medicine

## 2010-12-01 VITALS — BP 136/80 | HR 76 | Temp 98.2°F | Resp 16 | Ht 72.0 in | Wt 216.0 lb

## 2010-12-01 DIAGNOSIS — T887XXA Unspecified adverse effect of drug or medicament, initial encounter: Secondary | ICD-10-CM

## 2010-12-01 DIAGNOSIS — T753XXA Motion sickness, initial encounter: Secondary | ICD-10-CM

## 2010-12-01 DIAGNOSIS — M6740 Ganglion, unspecified site: Secondary | ICD-10-CM

## 2010-12-01 DIAGNOSIS — M67439 Ganglion, unspecified wrist: Secondary | ICD-10-CM

## 2010-12-01 MED ORDER — SCOPOLAMINE 1 MG/3DAYS TD PT72: 1.0000 | MEDICATED_PATCH | TRANSDERMAL | Status: DC

## 2011-02-28 ENCOUNTER — Encounter: Payer: Self-pay | Admitting: Internal Medicine

## 2011-02-28 NOTE — Progress Notes (Signed)
  Subjective:    Patient ID: Caleb Clayton, male    DOB: Jan 14, 1971, 40 y.o.   MRN: [id number]  HPI Patient presents today for monitoring of the medications used to control depression.  He also has a ganglion cyst on his hand that he wants evaluated for treatment. He has now been on a combination of Lipitor Depakote with good response his depression.  He is requesting a prescription for Transderm scopolamine for a planned trip.   Review of Systems  Constitutional: Negative for fever and fatigue.  HENT: Negative for hearing loss, congestion, neck pain and postnasal drip.   Eyes: Negative for discharge, redness and visual disturbance.  Respiratory: Negative for cough, shortness of breath and wheezing.   Cardiovascular: Negative for leg swelling.  Gastrointestinal: Negative for abdominal pain, constipation and abdominal distention.  Genitourinary: Negative for urgency and frequency.  Musculoskeletal: Negative for joint swelling and arthralgias.  Skin: Negative for color change and rash.  Neurological: Negative for weakness and light-headedness.  Hematological: Negative for adenopathy.  Psychiatric/Behavioral: Negative for behavioral problems.       Objective:   Physical Exam  Constitutional: He appears well-developed and well-nourished.  HENT:  Head: Normocephalic and atraumatic.  Eyes: Conjunctivae are normal. Pupils are equal, round, and reactive to light.  Neck: Normal range of motion. Neck supple.  Cardiovascular: Normal rate and regular rhythm.   Pulmonary/Chest: Effort normal and breath sounds normal.  Abdominal: Soft. Bowel sounds are normal.  Musculoskeletal:       Ganglion cyst          Assessment & Plan:  Aspirated ganglion cyst at no charge Reviewed her medications and their effect and side effect he is tolerating the combination well we encouraged him to continue the medications as prescribed the Depakote level should be obtained Prescription for scopolamine  was given for plantar

## 2011-03-04 ENCOUNTER — Encounter: Payer: Self-pay | Admitting: Internal Medicine

## 2011-03-04 ENCOUNTER — Ambulatory Visit (INDEPENDENT_AMBULATORY_CARE_PROVIDER_SITE_OTHER): Payer: 59 | Admitting: Internal Medicine

## 2011-03-04 VITALS — BP 136/88 | HR 76 | Temp 98.2°F | Resp 16 | Ht 72.0 in | Wt 226.0 lb

## 2011-03-04 DIAGNOSIS — R071 Chest pain on breathing: Secondary | ICD-10-CM

## 2011-03-04 DIAGNOSIS — R0789 Other chest pain: Secondary | ICD-10-CM

## 2011-03-04 DIAGNOSIS — F8081 Childhood onset fluency disorder: Secondary | ICD-10-CM

## 2011-03-04 DIAGNOSIS — T887XXA Unspecified adverse effect of drug or medicament, initial encounter: Secondary | ICD-10-CM

## 2011-03-04 NOTE — Patient Instructions (Signed)
The patient is instructed to continue all medications as prescribed. Schedule followup with check out clerk upon leaving the clinic  

## 2011-03-04 NOTE — Progress Notes (Signed)
Subjective:    Patient ID: Caleb Clayton, male    DOB: [birth year], 41 y.o.   MRN: [id number]  HPI This is a 41 year old white male who presents for followup of stuttering and for atypical depression on a combination of 2 seizure medications. He requires a Depakote level today it has been 6 months since his last Depakote level.  He reports no side effects from the medications other than weight gain.  Due to the weight gain he has some abdominal pain along the edge of the anterior ribs more pronounced on the left than the right no GI symptoms no pulmonary symptoms no exertional pain symptoms.      Review of Systems  Constitutional: Negative for fever and fatigue.  HENT: Negative for hearing loss, congestion, neck pain and postnasal drip.   Eyes: Negative for discharge, redness and visual disturbance.  Respiratory: Negative for cough, shortness of breath and wheezing.   Cardiovascular: Negative for leg swelling.  Gastrointestinal: Negative for abdominal pain, constipation and abdominal distention.  Genitourinary: Negative for urgency and frequency.  Musculoskeletal: Negative for joint swelling and arthralgias.  Skin: Negative for color change and rash.  Neurological: Negative for weakness and light-headedness.  Hematological: Negative for adenopathy.  Psychiatric/Behavioral: Negative for behavioral problems.   Past Medical History  Diagnosis Date  . Depression   . History of skin cancer   . Stutter     History   Social History  . Marital Status: Married    Spouse Name: N/A    Number of Children: N/A  . Years of Education: N/A   Occupational History  . Not on file.   Social History Main Topics  . Smoking status: Never Smoker   . Smokeless tobacco: Never Used  . Alcohol Use: No  . Drug Use: No  . Sexually Active: Not on file   Other Topics Concern  . Not on file   Social History Narrative  . No narrative on file    Past Surgical History  Procedure Date  .  Mole bx   . Arm fx   . Abdominal hysterectomy     Family History  Problem Relation Age of Onset  . Hyperlipidemia    . Hypertension    . Depression      No Known Allergies  Current Outpatient Prescriptions on File Prior to Visit  Medication Sig Dispense Refill  . divalproex (DEPAKOTE) 500 MG EC tablet Take 1,500 mg by mouth daily.       Marland Kitchen lamoTRIgine (LAMICTAL) 25 MG tablet Take 100 mg by mouth daily.       Marland Kitchen LORazepam (ATIVAN) 0.5 MG tablet Take 0.5 mg by mouth every 8 (eight) hours as needed.          BP 136/88  Pulse 76  Temp 98.2 F (36.8 C)  Resp 16  Ht 6' (1.829 m)  Wt 226 lb (102.513 kg)  BMI 30.65 kg/m2       Objective:   Physical Exam  Nursing note and vitals reviewed. Constitutional: He appears well-developed and well-nourished.  HENT:  Head: Normocephalic and atraumatic.  Eyes: Conjunctivae are normal. Pupils are equal, round, and reactive to light.  Neck: Normal range of motion. Neck supple.  Cardiovascular: Normal rate and regular rhythm.   Pulmonary/Chest: Effort normal and breath sounds normal. He exhibits tenderness.  Abdominal: Soft. Bowel sounds are normal. There is tenderness.          Assessment & Plan:  Depakote level drawn today and sent  to Dr. Emerson Monte for monitoring of his antidepressant therapy.  No side effects noted.  Chronic stuttering also treated with seizure medications.  Abdominal pain chest wall pain left anterior chest lower floating ribs secondary to weight gain and  distention

## 2011-04-28 ENCOUNTER — Telehealth: Payer: Self-pay | Admitting: *Deleted

## 2011-04-28 MED ORDER — TOBRAMYCIN-DEXAMETHASONE 0.3-0.1 % OP SUSP: 2.0000 [drp] | OPHTHALMIC | Status: AC

## 2011-04-28 NOTE — Telephone Encounter (Signed)
Notified pt. 

## 2011-04-28 NOTE — Telephone Encounter (Signed)
Per dr Lovell Sheehan- tobradex 2 drops in affected eye every 4 hours while awake for 7 days

## 2011-04-28 NOTE — Telephone Encounter (Signed)
Pt. States he has a huge sty on his left eye that has now turned his entire red, and looks like pink eye now.

## 2011-06-03 ENCOUNTER — Ambulatory Visit: Payer: 59 | Admitting: Internal Medicine

## 2011-07-28 ENCOUNTER — Encounter: Payer: Self-pay | Admitting: Internal Medicine

## 2011-07-28 ENCOUNTER — Ambulatory Visit (INDEPENDENT_AMBULATORY_CARE_PROVIDER_SITE_OTHER): Payer: 59 | Admitting: Internal Medicine

## 2011-07-28 VITALS — BP 140/80 | HR 64 | Temp 98.0°F | Resp 16 | Ht 72.0 in | Wt 225.0 lb

## 2011-07-28 DIAGNOSIS — C44509 Unspecified malignant neoplasm of skin of other part of trunk: Secondary | ICD-10-CM

## 2011-07-28 DIAGNOSIS — C44599 Other specified malignant neoplasm of skin of other part of trunk: Secondary | ICD-10-CM

## 2011-07-28 NOTE — Patient Instructions (Signed)
Skin Cancer Most skin cancers occur on the face, neck, arms, or on sun-exposed areas. Damage from excessive sunlight exposure is the most common cause of skin cancer. People with light skin are more likely to develop skin cancer than people with more skin pigment, but skin cancers occur in people of all races. Squamous and basal cell cancers are the most common types. They are both slow-growing and easy to recognize. They often form scaly open sores that do not heal. Melanoma is a skin cancer that looks like a mole. Most moles are harmless, especially if they do not change over time. Most skin cancers can be prevented by avoiding excessive sunlight exposure. Signs that a skin sore or mole may be cancerous include:  A sore that does not heal or one that bleeds or slowly gets bigger.   Moles that are growing or are larger than a pencil eraser, or ones that have irregular borders or color variations.   Moles that have a bump, a raised area, or cause itching or pain.  If you have a skin sore or mole that could be cancer, it is important that you call your doctor right away for a skin exam and possible skin biopsy to determine the best treatment. Document Released: 03/24/2004 Document Revised: 02/03/2011 Document Reviewed: 07/04/2008 [facility] Patient Information 2012 [place], [place].

## 2011-07-28 NOTE — Progress Notes (Signed)
  Subjective:    Patient ID: Caleb Clayton, male    DOB: [birth year], 41 y.o.   MRN: [id number]  HPI This is a 41 year old male with a strong family history of skin cancer who presents for his six-month screening he has a history of actinic keratosis P. basAL cell lesions on back and shoulders he HAS a long history of sun exposure.     Review of Systems  Constitutional: Negative for fever and fatigue.  HENT: Negative for hearing loss, congestion, neck pain and postnasal drip.   Eyes: Negative for discharge, redness and visual disturbance.  Respiratory: Negative for cough, shortness of breath and wheezing.   Cardiovascular: Negative for leg swelling.  Gastrointestinal: Negative for abdominal pain, constipation and abdominal distention.  Genitourinary: Negative for urgency and frequency.  Musculoskeletal: Negative for joint swelling and arthralgias.  Skin: Negative for color change and rash.  Neurological: Negative for weakness and light-headedness.  Hematological: Negative for adenopathy.  Psychiatric/Behavioral: Negative for behavioral problems.       Objective:   Physical Exam  Nursing note and vitals reviewed. Constitutional: He appears well-developed and well-nourished.  HENT:  Head: Normocephalic and atraumatic.  Eyes: Conjunctivae are normal. Pupils are equal, round, and reactive to light.  Neck: Normal range of motion. Neck supple.  Cardiovascular: Normal rate and regular rhythm.   Pulmonary/Chest: Effort normal and breath sounds normal.  Abdominal: Soft. Bowel sounds are normal.  Skin:       2 suspicious lesions identified one on the upper mid back one on the middle mid back          Assessment & Plan:  Skin survey identified 2 possible shave biopsies for basal cell changes and possible pre-melanotic lesions  Informed consent was given by the patient for a shave biopsy. The site was prepped with Betadine and using a 15 blade a 1 cm shave biopsy was obtained from  the upper back this is specimen #1. The specimin was placed in preservative and sent for pathology. Hemostasis was achieved with a compression. Wound care was discussed with the patient. The patient was informed that it would be one to 2 weeks before the pathology will be interpreted. Informed consent was given by the patient for a shave biopsy. The site was prepped with Betadine and using a 15 blade a 1 cm shave biopsy was obtained from the middle back this is specimen #2. The specimin was placed in preservative and sent for pathology. Hemostasis was achieved with a compression. Wound care was discussed with the patient. The patient was informed that it would be one to 2 weeks before the pathology will be interpreted.

## 2011-07-28 NOTE — Progress Notes (Signed)
Addended by: Willy Eddy on: 07/28/2011 12:56 PM   Modules accepted: Orders

## 2011-08-05 ENCOUNTER — Telehealth: Payer: Self-pay | Admitting: *Deleted

## 2011-08-05 NOTE — Telephone Encounter (Signed)
Left message on machine Per dr Lovell Sheehan- bp may be up because he has gained some weight- watch what he eats and cut back on salt- monitor and let us know what has been over the weekend

## 2011-08-05 NOTE — Telephone Encounter (Signed)
Pt called stating bp has been slight elevated this am- 150/100- Left message on machine To watch salt and monitor.

## 2012-01-30 ENCOUNTER — Encounter: Payer: 59 | Admitting: Internal Medicine

## 2012-01-30 DIAGNOSIS — Z0289 Encounter for other administrative examinations: Secondary | ICD-10-CM

## 2012-03-02 ENCOUNTER — Emergency Department (HOSPITAL_BASED_OUTPATIENT_CLINIC_OR_DEPARTMENT_OTHER): Payer: 59

## 2012-03-02 ENCOUNTER — Encounter (HOSPITAL_BASED_OUTPATIENT_CLINIC_OR_DEPARTMENT_OTHER): Payer: Self-pay | Admitting: *Deleted

## 2012-03-02 ENCOUNTER — Emergency Department (HOSPITAL_BASED_OUTPATIENT_CLINIC_OR_DEPARTMENT_OTHER)
Admission: EM | Admit: 2012-03-02 | Discharge: 2012-03-03 | Disposition: A | Discharge status: Home / Self Care | Payer: 59 | Attending: Emergency Medicine | Admitting: Emergency Medicine

## 2012-03-02 DIAGNOSIS — F329 Major depressive disorder, single episode, unspecified: Secondary | ICD-10-CM | POA: Insufficient documentation

## 2012-03-02 DIAGNOSIS — Y9351 Activity, roller skating (inline) and skateboarding: Secondary | ICD-10-CM | POA: Insufficient documentation

## 2012-03-02 DIAGNOSIS — S59919A Unspecified injury of unspecified forearm, initial encounter: Secondary | ICD-10-CM | POA: Insufficient documentation

## 2012-03-02 DIAGNOSIS — S6990XA Unspecified injury of unspecified wrist, hand and finger(s), initial encounter: Secondary | ICD-10-CM | POA: Insufficient documentation

## 2012-03-02 DIAGNOSIS — M79602 Pain in left arm: Secondary | ICD-10-CM

## 2012-03-02 DIAGNOSIS — F8089 Other developmental disorders of speech and language: Secondary | ICD-10-CM | POA: Insufficient documentation

## 2012-03-02 DIAGNOSIS — S4980XA Other specified injuries of shoulder and upper arm, unspecified arm, initial encounter: Secondary | ICD-10-CM | POA: Insufficient documentation

## 2012-03-02 DIAGNOSIS — S59909A Unspecified injury of unspecified elbow, initial encounter: Secondary | ICD-10-CM | POA: Insufficient documentation

## 2012-03-02 DIAGNOSIS — F3289 Other specified depressive episodes: Secondary | ICD-10-CM | POA: Insufficient documentation

## 2012-03-02 DIAGNOSIS — W19XXXA Unspecified fall, initial encounter: Secondary | ICD-10-CM

## 2012-03-02 DIAGNOSIS — S46909A Unspecified injury of unspecified muscle, fascia and tendon at shoulder and upper arm level, unspecified arm, initial encounter: Secondary | ICD-10-CM | POA: Insufficient documentation

## 2012-03-02 DIAGNOSIS — Y929 Unspecified place or not applicable: Secondary | ICD-10-CM | POA: Insufficient documentation

## 2012-03-02 DIAGNOSIS — Z79899 Other long term (current) drug therapy: Secondary | ICD-10-CM | POA: Insufficient documentation

## 2012-03-02 DIAGNOSIS — Z85828 Personal history of other malignant neoplasm of skin: Secondary | ICD-10-CM | POA: Insufficient documentation

## 2012-03-02 NOTE — ED Notes (Signed)
Left elbow, shoulder, upper arm and neck pain after he fell while skating tonight.

## 2012-03-03 ENCOUNTER — Emergency Department (HOSPITAL_BASED_OUTPATIENT_CLINIC_OR_DEPARTMENT_OTHER): Payer: 59

## 2012-03-03 NOTE — ED Provider Notes (Signed)
History     CSN: [id number]  Arrival date & time 03/02/12  2243   First MD Initiated Contact with Patient 03/03/12 0012      Chief Complaint  Patient presents with  . Arm Pain    (Consider location/radiation/quality/duration/timing/severity/associated sxs/prior treatment) The history is provided by the patient.  Caleb Clayton is a 42 y.o. male here with L arm pain. He was skate boarding tonight and fell on L arm. + L shoulder and L forearm pain afterwards. No head injury. No other injuries. Has hx of L forearm fracture s/p pins.    Past Medical History  Diagnosis Date  . Depression   . History of skin cancer   . Stutter     Past Surgical History  Procedure Date  . Mole bx   . Arm fx     Family History  Problem Relation Age of Onset  . Hyperlipidemia    . Hypertension    . Depression      History  Substance Use Topics  . Smoking status: Never Smoker   . Smokeless tobacco: Never Used  . Alcohol Use: No      Review of Systems  Musculoskeletal:       L arm pain   All other systems reviewed and are negative.    Allergies  Review of patient's allergies indicates no known allergies.  Home Medications   Current Outpatient Rx  Name  Route  Sig  Dispense  Refill  . DIVALPROEX SODIUM 500 MG PO TBEC   Oral   Take 1,500 mg by mouth daily.          Marland Kitchen LAMOTRIGINE 25 MG PO TABS   Oral   Take 100 mg by mouth daily.          Marland Kitchen LORAZEPAM 0.5 MG PO TABS   Oral   Take 0.5 mg by mouth every 8 (eight) hours as needed.             BP 131/80  Pulse 77  Temp 97.5 F (36.4 C) (Oral)  Resp 20  SpO2 99%  Physical Exam  Nursing note and vitals reviewed. Constitutional: He is oriented to person, place, and time. He appears well-developed and well-nourished.  HENT:  Head: Normocephalic and atraumatic.  Mouth/Throat: Oropharynx is clear and moist.  Eyes: Conjunctivae normal are normal. Pupils are equal, round, and reactive to light.  Neck: Normal range  of motion. Neck supple.       No midline tenderness   Cardiovascular: Normal rate, regular rhythm and normal heart sounds.   Pulmonary/Chest: Effort normal and breath sounds normal.  Abdominal: Soft. Bowel sounds are normal.  Musculoskeletal: Normal range of motion.       L forearm no obvious deformity. Mild tenderness proximal radius. NL elbow ROM. NL wrist ROM. 2+ pulses.   Neurological: He is alert and oriented to person, place, and time.  Skin: Skin is warm and dry.  Psychiatric: He has a normal mood and affect. His behavior is normal. Judgment and thought content normal.    ED Course  Procedures (including critical care time)  Labs Reviewed - No data to display Dg Forearm Left  03/03/2012  *RADIOLOGY REPORT*  Clinical Data: Proximal forearm pain post fall  LEFT FOREARM - 2 VIEW  Comparison: None  Findings: Osseous mineralization normal. Wrist and elbow joint alignments grossly normal. No acute fracture, dislocation or bone destruction.  IMPRESSION: No acute osseous abnormalities.   Original Report Authenticated By: Ulyses Southward,  M.D.    Dg Humerus Left  03/03/2012  *RADIOLOGY REPORT*  Clinical Data: Distal left upper arm pain following a fall.  LEFT HUMERUS - 2+ VIEW  Comparison: None.  Findings: Normal appearing bones and soft tissues without fracture or dislocation.  IMPRESSION: Normal examination.   Original Report Authenticated By: Beckie Salts, M.D.      No diagnosis found.    MDM  Caleb Clayton is a 42 y.o. male here with L forearm pain. Xrays showed no fractures. He declined pain meds. I offered a splint but he declined. He will f/u with his orthopedic doctor.         Richardean Canal, MD 03/03/12 [phone]

## 2012-03-11 NOTE — Progress Notes (Signed)
  Subjective:    Patient ID: Caleb Clayton, male    DOB: Mar 25, 1970, 42 y.o.   MRN: [id number]  HPI Opened in error Pt moved Not a no show   Review of Systems     Objective:   Physical Exam        Assessment & Plan:  oped in erro

## 2012-05-09 ENCOUNTER — Ambulatory Visit (INDEPENDENT_AMBULATORY_CARE_PROVIDER_SITE_OTHER): Payer: 59 | Admitting: Internal Medicine

## 2012-05-09 ENCOUNTER — Encounter: Payer: Self-pay | Admitting: Internal Medicine

## 2012-05-09 VITALS — BP 120/84 | HR 72 | Temp 98.6°F | Resp 16 | Ht 72.0 in | Wt 225.0 lb

## 2012-05-09 DIAGNOSIS — R635 Abnormal weight gain: Secondary | ICD-10-CM

## 2012-05-09 DIAGNOSIS — C44599 Other specified malignant neoplasm of skin of other part of trunk: Secondary | ICD-10-CM

## 2012-05-09 MED ORDER — LORAZEPAM 0.5 MG PO TABS: 0.5000 mg | ORAL_TABLET | Freq: Three times a day (TID) | ORAL | Status: DC | PRN

## 2012-05-09 NOTE — Addendum Note (Signed)
Addended by: Willy Eddy on: 05/09/2012 04:17 PM   Modules accepted: Orders

## 2012-05-09 NOTE — Patient Instructions (Signed)
Discuss the change of the lamictal to topamax 50 mg for weight loss

## 2012-05-09 NOTE — Progress Notes (Signed)
  Subjective:    Patient ID: Caleb Clayton, male    DOB: 1971-02-18, 42 y.o.   MRN: [id number]  HPI Increased anxiety Weight gain and need to appetitive suppressant diet   Review of Systems  Constitutional: Negative for fever and fatigue.  HENT: Negative for hearing loss, congestion, neck pain and postnasal drip.   Eyes: Negative for discharge, redness and visual disturbance.  Respiratory: Negative for cough, shortness of breath and wheezing.   Cardiovascular: Negative for leg swelling.  Gastrointestinal: Negative for abdominal pain, constipation and abdominal distention.  Genitourinary: Negative for urgency and frequency.  Musculoskeletal: Negative for joint swelling and arthralgias.  Skin: Negative for color change and rash.  Neurological: Negative for weakness and light-headedness.  Hematological: Negative for adenopathy.  Psychiatric/Behavioral: Negative for behavioral problems.       Objective:   Physical Exam  Constitutional: He appears well-developed and well-nourished.  HENT:  Head: Normocephalic and atraumatic.  Eyes: Conjunctivae are normal. Pupils are equal, round, and reactive to light.  Neck: Normal range of motion. Neck supple.  Cardiovascular: Normal rate and regular rhythm.   Pulmonary/Chest: Effort normal and breath sounds normal.  Abdominal: Soft. Bowel sounds are normal.          Assessment & Plan:  reexcision of previously biopsied mole on lower back to 2 regrowth of pigmented lesion New lesion identified on upper right shoulder  Weight gain noted due to medications Discussed psych to change lamictal?  Informed consent was given by the patient for a shave biopsy. The site (s) was prepped with Betadine and using a 15 blade a 1 cm shave biopsy was obtained. The specimin was placed in preservative and sent for pathology. Hemostasis was achieved with a compression. Wound care was discussed with the patient. The patient was informed that it would be one  to 2 weeks before the pathology will be interpreted.

## 2012-10-10 ENCOUNTER — Encounter: Payer: Self-pay | Admitting: Internal Medicine

## 2012-11-09 ENCOUNTER — Ambulatory Visit: Payer: 59 | Admitting: Internal Medicine

## 2012-11-28 ENCOUNTER — Encounter: Payer: Self-pay | Admitting: Internal Medicine

## 2012-11-28 ENCOUNTER — Ambulatory Visit (INDEPENDENT_AMBULATORY_CARE_PROVIDER_SITE_OTHER): Payer: 59 | Admitting: Internal Medicine

## 2012-11-28 VITALS — BP 130/80 | HR 76 | Temp 98.6°F | Resp 16 | Ht 72.0 in | Wt 235.0 lb

## 2012-11-28 DIAGNOSIS — D239 Other benign neoplasm of skin, unspecified: Secondary | ICD-10-CM

## 2012-11-28 DIAGNOSIS — Z85828 Personal history of other malignant neoplasm of skin: Secondary | ICD-10-CM

## 2012-11-28 DIAGNOSIS — D229 Melanocytic nevi, unspecified: Secondary | ICD-10-CM

## 2012-11-28 NOTE — Addendum Note (Signed)
Addended by: Willy Eddy on: 11/28/2012 04:49 PM   Modules accepted: Orders

## 2012-11-28 NOTE — Progress Notes (Signed)
  Subjective:    Patient ID: Caleb Clayton, male    DOB: September 29, 1970, 42 y.o.   MRN: [id number]  HPI Patient presents for a mole check with a history of premalignant and pre- melanoma lesions. Therethere was one suspicious lesion in the low back that was prepared for excision. A combination of his medicine and his diet has resulted in weight gain   Review of Systems  Constitutional: Negative for fever and fatigue.  HENT: Negative for hearing loss, congestion, neck pain and postnasal drip.   Eyes: Negative for discharge, redness and visual disturbance.  Respiratory: Negative for cough, shortness of breath and wheezing.   Cardiovascular: Negative for leg swelling.  Gastrointestinal: Negative for abdominal pain, constipation and abdominal distention.  Genitourinary: Negative for urgency and frequency.  Musculoskeletal: Negative for joint swelling and arthralgias.  Skin: Negative for color change and rash.  Neurological: Negative for weakness and light-headedness.  Hematological: Negative for adenopathy.  Psychiatric/Behavioral: Negative for behavioral problems.       Objective:   Physical Exam  HENT:  Head: Normocephalic.  Eyes: Conjunctivae are normal. Pupils are equal, round, and reactive to light.  Pulmonary/Chest: Effort normal and breath sounds normal.  Abdominal: Soft. Bowel sounds are normal.  Skin:  Lesion on mid lower back          Assessment & Plan:  Weight gain as a side effect of the psychiatric medicine.  Recommend gluten-free diet for weight loss.  Identified a mole on the mid lower back site was prepped with Betadine and using a #15 blade the site was excised and sent for pathology

## 2013-03-25 ENCOUNTER — Other Ambulatory Visit: Payer: Self-pay | Admitting: Internal Medicine

## 2013-06-10 ENCOUNTER — Ambulatory Visit: Payer: 59 | Admitting: Internal Medicine

## 2013-10-28 ENCOUNTER — Ambulatory Visit (INDEPENDENT_AMBULATORY_CARE_PROVIDER_SITE_OTHER): Payer: 59 | Admitting: Physician Assistant

## 2013-10-28 ENCOUNTER — Encounter: Payer: Self-pay | Admitting: Physician Assistant

## 2013-10-28 VITALS — BP 100/76 | HR 96 | Temp 100.7°F | Resp 18 | Wt 233.1 lb

## 2013-10-28 DIAGNOSIS — J029 Acute pharyngitis, unspecified: Secondary | ICD-10-CM

## 2013-10-28 LAB — POCT RAPID STREP A (OFFICE): RAPID STREP A SCREEN: NEGATIVE

## 2013-10-28 MED ORDER — AMOXICILLIN 500 MG PO CAPS: 500.0000 mg | ORAL_CAPSULE | Freq: Two times a day (BID) | ORAL | Status: DC

## 2013-10-28 NOTE — Progress Notes (Signed)
Subjective:    Patient ID: Caleb Clayton, male    DOB: Oct 19, 1970, 43 y.o.   MRN: [id number]  Sore Throat  This is a new problem. The current episode started in the past 7 days (3 days). The problem has been gradually worsening. Neither side of throat is experiencing more pain than the other. The maximum temperature recorded prior to his arrival was 100 - 100.9 F. The fever has been present for 1 to 2 days. The pain is at a severity of 8/10. The pain is severe. Associated symptoms include congestion, coughing (occasionally, dry, from throat irritation. ), headaches, a hoarse voice, swollen glands and trouble swallowing. Pertinent negatives include no abdominal pain, diarrhea, drooling, ear discharge, ear pain, plugged ear sensation, neck pain, shortness of breath, stridor or vomiting. He has had exposure to strep (daughter had Strep throat 1 to 2 weeks ago, treated with abx.). He has had no exposure to mono. He has tried acetaminophen (robutussin) for the symptoms.      Review of Systems  Constitutional: Positive for fever and chills.  HENT: Positive for congestion, hoarse voice, postnasal drip, sore throat and trouble swallowing. Negative for drooling, ear discharge and ear pain.   Respiratory: Positive for cough (occasionally, dry, from throat irritation. ). Negative for shortness of breath and stridor.   Cardiovascular: Negative for chest pain.  Gastrointestinal: Positive for nausea. Negative for vomiting, abdominal pain and diarrhea.  Musculoskeletal: Negative for neck pain.  Neurological: Positive for headaches. Negative for syncope.  All other systems reviewed and are negative.    Past Medical History  Diagnosis Date  . Depression   . History of skin cancer   . Stutter     History   Social History  . Marital Status: Married    Spouse Name: N/A    Number of Children: N/A  . Years of Education: N/A   Occupational History  . Not on file.   Social History Main Topics  .  Smoking status: Never Smoker   . Smokeless tobacco: Never Used  . Alcohol Use: No  . Drug Use: No  . Sexual Activity: Not on file   Other Topics Concern  . Not on file   Social History Narrative  . No narrative on file    Past Surgical History  Procedure Laterality Date  . Mole bx    . Arm fx      Family History  Problem Relation Age of Onset  . Hyperlipidemia    . Hypertension    . Depression      No Known Allergies  Current Outpatient Prescriptions on File Prior to Visit  Medication Sig Dispense Refill  . divalproex (DEPAKOTE) 500 MG EC tablet Take 1,500 mg by mouth daily.       Marland Kitchen lamoTRIgine (LAMICTAL) 25 MG tablet Take 100 mg by mouth daily.       Marland Kitchen LORazepam (ATIVAN) 0.5 MG tablet TAKE ONE TABLET BY MOUTH EVERY 8 HOURS AS NEEDED  30 tablet  3   No current facility-administered medications on file prior to visit.    EXAM: BP 100/76  Pulse 96  Temp(Src) 100.7 F (38.2 C) (Oral)  Resp 18  Wt 233 lb 1.6 oz (105.733 kg)     Objective:   Physical Exam  Nursing note and vitals reviewed. Constitutional: He is oriented to person, place, and time. He appears well-developed and well-nourished. No distress.  HENT:  Head: Normocephalic and atraumatic.  Right Ear: External ear normal.  Left Ear: External ear normal.  Nose: Nose normal.  Mouth/Throat: Oropharyngeal exudate present.  Oropharynx is erythematous with exudate. Bilateral TMs normal. Bilateral frontal and maxillary sinuses non-TTP.  Eyes: Conjunctivae and EOM are normal. Pupils are equal, round, and reactive to light.  Neck: Normal range of motion. Neck supple.  Cardiovascular: Normal rate, regular rhythm and intact distal pulses.   Pulmonary/Chest: Effort normal and breath sounds normal. No stridor. No respiratory distress. He has no wheezes. He has no rales. He exhibits no tenderness.  Lymphadenopathy:    He has cervical adenopathy (mild anterior adenopathy.).  Neurological: He is alert and oriented  to person, place, and time.  Skin: Skin is warm and dry. No rash noted. He is not diaphoretic.  Psychiatric: He has a normal mood and affect. His behavior is normal. Judgment and thought content normal.     Lab Results  Component Value Date   WBC 5.2 09/03/2009   HGB 14.0 09/03/2009   HCT 41.3 09/03/2009   PLT 173.0 09/03/2009   GLUCOSE 85 09/03/2009   CHOL 149 09/03/2009   TRIG 94.0 09/03/2009   HDL 35.00* 09/03/2009   LDLCALC 95 09/03/2009   ALT 23 09/03/2009   AST 23 09/03/2009   NA 142 09/03/2009   K 4.5 09/03/2009   CL 103 09/03/2009   CREATININE 1.0 09/03/2009   BUN 11 09/03/2009   CO2 30 09/03/2009   TSH 1.18 09/03/2009        Assessment & Plan:  Sahil was seen today for sore throat.  Diagnoses and associated orders for this visit:  Sore throat Comments: Rapid strep negative, however high suspicion, will treat with amoxicillin. Followup is symptoms persist or worsen. - POCT rapid strep A - amoxicillin (AMOXIL) 500 MG capsule; Take 1 capsule (500 mg total) by mouth 2 (two) times daily.    Advised pt to also push fluid hydration to help recovery. Sore throat lozenges and saltwater gargles. Tylenol for fever.  Return precautions provided, and patient handout on sore throat.  Plan to follow up as needed, or for worsening or persistent symptoms despite treatment.  Patient Instructions  Amoxicillin twice daily for 10 days to treat suspected strep throat infection.  You can take tylenol for fever symptoms.  You can try saltwater gargles or sore throat lozenges also to help sore throat symptoms.  Schedule an appointment see your dentist regarding the mouth lesion.  If emergency symptoms discussed during visit developed, seek medical attention immediately.  Followup as needed, or for worsening or persistent symptoms despite treatment.

## 2013-10-28 NOTE — Patient Instructions (Addendum)
Amoxicillin twice daily for 10 days to treat suspected strep throat infection.  You can take tylenol for fever symptoms.  You can try saltwater gargles or sore throat lozenges also to help sore throat symptoms.  Schedule an appointment see your dentist regarding the mouth lesion.  If emergency symptoms discussed during visit developed, seek medical attention immediately.  Followup as needed, or for worsening or persistent symptoms despite treatment.     Sore Throat A sore throat is a painful, burning, sore, or scratchy feeling of the throat. There may be pain or tenderness when swallowing or talking. You may have other symptoms with a sore throat. These include coughing, sneezing, fever, or a swollen neck. A sore throat is often the first sign of another sickness. These sicknesses may include a cold, flu, strep throat, or an infection called mono. Most sore throats go away without medical treatment.  HOME CARE   Only take medicine as told by your doctor.  Drink enough fluids to keep your pee (urine) clear or pale yellow.  Rest as needed.  Try using throat sprays, lozenges, or suck on hard candy (if older than 4 years or as told).  Sip warm liquids, such as broth, herbal tea, or warm water with honey. Try sucking on frozen ice pops or drinking cold liquids.  Rinse the mouth (gargle) with salt water. Mix 1 teaspoon salt with 8 ounces of water.  Do not smoke. Avoid being around others when they are smoking.  Put a humidifier in your bedroom at night to moisten the air. You can also turn on a hot shower and sit in the bathroom for 5-10 minutes. Be sure the bathroom door is closed. GET HELP RIGHT AWAY IF:   You have trouble breathing.  You cannot swallow fluids, soft foods, or your spit (saliva).  You have more puffiness (swelling) in the throat.  Your sore throat does not get better in 7 days.  You feel sick to your stomach (nauseous) and throw up (vomit).  You have a fever  or lasting symptoms for more than 2-3 days.  You have a fever and your symptoms suddenly get worse. MAKE SURE YOU:   Understand these instructions.  Will watch your condition.  Will get help right away if you are not doing well or get worse. Document Released: 11/24/2007 Document Revised: 11/09/2011 Document Reviewed: 10/23/2011 [facility] Patient Information 2015 [place], [place]. This information is not intended to replace advice given to you by your health care provider. Make sure you discuss any questions you have with your health care provider.

## 2014-01-13 ENCOUNTER — Encounter: Payer: Self-pay | Admitting: Family Medicine

## 2014-01-13 ENCOUNTER — Ambulatory Visit (INDEPENDENT_AMBULATORY_CARE_PROVIDER_SITE_OTHER): Payer: 59 | Admitting: Family Medicine

## 2014-01-13 DIAGNOSIS — F3132 Bipolar disorder, current episode depressed, moderate: Secondary | ICD-10-CM

## 2014-01-13 DIAGNOSIS — F319 Bipolar disorder, unspecified: Secondary | ICD-10-CM | POA: Insufficient documentation

## 2014-01-13 MED ORDER — LAMOTRIGINE 25 MG PO TABS: 25.0000 mg | ORAL_TABLET | Freq: Every day | ORAL | Status: DC

## 2014-01-13 MED ORDER — LORAZEPAM 0.5 MG PO TABS: ORAL_TABLET | ORAL | Status: DC

## 2014-01-13 NOTE — Progress Notes (Signed)
Caleb Reddish, MD Phone: [phone]  Subjective:  Patient presents today to establish care with me as their new primary care provider. Patient was formerly a patient of Dr. Arnoldo Morale. Chief complaint-noted.   Bipolar Disorder (reports I, from history possibly II) Has seen Dr. Toy Cookey most recently. Previously with psych off [address] [facility] who gave original diagnosis.   Financial concerns recently. Had to pay $75 per visit vs. $20 here and had to discontinue seeing Dr. Toy Cookey until next year. Out of medications for 2 months-lamictal 100mg  daily, depakote 1500 to 2000mg  daily, ativan 0.5 mg prn q8 hours. Patient describes a history of cycling between manic (very irritable, angry over silly things) and depressive symptoms (sitting around house, sleep, watch tv and not get out). Over last 2 months, describes a very down/depressed state. He wants to sit around and be left alone which is not normal for him. Stressors including: Oldest child with ADHD but cannot afford medicine and that is making house concerns difficult. 2 kids 8 and 6.   No other medications have ever been used in the past. No history of rash on lamictal or other issues.   PHQ9 of 15 today.   ROS- No SI/HI. No shopping sprees or abnormal sexual behavior.   The following were reviewed and entered/updated in epic: Past Medical History  Diagnosis Date  . Depression   . History of skin cancer   . Stutter    Patient Active Problem List   Diagnosis Date Noted  . Bipolar disorder 01/13/2014    Priority: High  . STUTTERING 08/24/2009    Priority: High  . Snoring 05/08/2007    Priority: Low  . SKIN CANCER, HX OF 11/20/2006    Priority: Low   Past Surgical History  Procedure Laterality Date  . Mole bx    . Arm fx    . Hernia repair      21 mo old    Family History  Problem Relation Age of Onset  . Hyperlipidemia Mother     father  . Hypertension Mother     father  . Depression Father   . Bipolar  disorder Neg Hx     Medications- reviewed and updated Current Outpatient Prescriptions  Medication Sig Dispense Refill  . lamoTRIgine (LAMICTAL) 25 MG tablet Take 1 tablet (25 mg total) by mouth daily. Take 1 pill for 2 weeks, then take 2 pills for 2 weeks, then 4 pills for 1 week then see Dr. Yong Channel 70 tablet 1  . LORazepam (ATIVAN) 0.5 MG tablet TAKE ONE TABLET BY MOUTH EVERY 8 HOURS AS NEEDED 30 tablet 1   No current facility-administered medications for this visit.    Allergies-reviewed and updated No Known Allergies  History   Social History  . Marital Status: Married    Spouse Name: N/A    Number of Children: N/A  . Years of Education: N/A   Social History Main Topics  . Smoking status: Never Smoker   . Smokeless tobacco: Never Used  . Alcohol Use: No  . Drug Use: No  . Sexual Activity: None   Other Topics Concern  . None   Social History Narrative   Married. 2 kids.       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       Hobbies: yardwork, basketball    ROS--See HPI   Objective: BP 122/84 mmHg  Pulse 76  Temp(Src) 97.6 F (36.4 C)  Wt 232 lb (105.235 kg) Gen:  NAD, resting comfortably CV: RRR no murmurs rubs or gallops Lungs: CTAB no crackles, wheeze, rhonchi Ext: no edema Skin: warm, dry Neuro: grossly normal, moves all extremities , stutters per baseline Psych: not depressed, not anxious or irritable.   Assessment/Plan:  Bipolar disorder New diagnosis to this practice. Previously only listed as depression. Patient off medication x 2 months. Depression primary symptoms. We agreed to start with lamictal and titrate to 100mg  and see once to that point unless any manic or suicidal depressions arise. He has ativan as needed in this case (mania) but needs to get in immediately. We will try to start back his depakote at next visit as he tends to swing from depression to mania and desire to control both. Hopeful patient can return to  psychiatric care next year when finances in better shape.    Return precautions advised specifically Reviewed lamictal rash precautions.   >50% of 30 minute office visit was spent on counseling ( importance of not coming off medications, benefits and risks of benzos as well as lamictal as well as Depakote, return precautions) and coordination of care  Meds ordered this encounter  Medications  . lamoTRIgine (LAMICTAL) 25 MG tablet    Sig: Take 1 tablet (25 mg total) by mouth daily. Take 1 pill for 2 weeks, then take 2 pills for 2 weeks, then 4 pills for 1 week then see Dr. Yong Channel    Dispense:  70 tablet    Refill:  1  . LORazepam (ATIVAN) 0.5 MG tablet    Sig: TAKE ONE TABLET BY MOUTH EVERY 8 HOURS AS NEEDED    Dispense:  30 tablet    Refill:  1

## 2014-01-13 NOTE — Assessment & Plan Note (Addendum)
New diagnosis to this practice. Previously only listed as depression. Patient off medication x 2 months. Depression primary symptoms. We agreed to start with lamictal and titrate to 100mg  and see once to that point unless any manic or suicidal depressions arise. He has ativan as needed in this case (mania) but needs to get in immediately. We will try to start back his depakote at next visit as he tends to swing from depression to mania and desire to control both. Hopeful patient can return to psychiatric care next year when finances in better shape. Obtain records from Dr. Toy Cookey.

## 2014-01-13 NOTE — Patient Instructions (Addendum)
Wonderful to meet you.   Let's get your lamictal back to your previous dose first then work on the depakote. If your symptoms worsen you need to seek care immediately. Also, if you had thoughts of hurting yourself, call 911.   See me in 5 weeks and we may start depakote back. May use ativan as needed.

## 2014-03-03 ENCOUNTER — Ambulatory Visit (INDEPENDENT_AMBULATORY_CARE_PROVIDER_SITE_OTHER): Payer: 59 | Admitting: Family Medicine

## 2014-03-03 ENCOUNTER — Encounter: Payer: Self-pay | Admitting: Family Medicine

## 2014-03-03 VITALS — BP 128/72 | HR 76 | Temp 98.1°F | Wt 237.0 lb

## 2014-03-03 DIAGNOSIS — F3132 Bipolar disorder, current episode depressed, moderate: Secondary | ICD-10-CM

## 2014-03-03 MED ORDER — LAMOTRIGINE 25 MG PO TABS: 100.0000 mg | ORAL_TABLET | Freq: Every day | ORAL | Status: DC

## 2014-03-03 MED ORDER — DIVALPROEX SODIUM ER 500 MG PO TB24
1500.0000 mg | ORAL_TABLET | Freq: Every day | ORAL | Status: DC
Start: 2014-03-03 — End: 2014-10-09

## 2014-03-03 NOTE — Assessment & Plan Note (Addendum)
Depression has improved with phq9 of 7 down from 15 with restarting lamictal. History of mania as well so really need to get patient on depakote as well-we will restart at 1500mg  today. He will follow up in 6 weeks. given red flags for return.

## 2014-03-03 NOTE — Patient Instructions (Addendum)
Continue Lamictal. Restart depakote at 1500mg  per day.  If your symptoms worsen you need to seek care immediately. Also, if you had thoughts of hurting yourself, call 911.   See me in 6 weeks and recheck  Encourage eating breakfast Adding at least 2 30 minute sessions exercise per week.  Try to push yourself a little more such as jogging or lifting weights Watch portion size at dinner Perhaps drink water instead of diet soda

## 2014-03-03 NOTE — Progress Notes (Signed)
  Garret Reddish, MD Phone: [phone]  Subjective:   Caleb Clayton is a 44 y.o. year old very pleasant male patient who presents with the following:  Bipolar disorder PHQ9 of 7 down from 84. Feels like symptoms are slowly improving. No mania. Depression improving. He previously was on depakote as well. Has not had to use ativan for mania. He will be going back to psychiatry off [address] once his financial situation stabilizes (which it is)  ROS-no rash on Lamictal. No SI/HI.   Past Medical History- Patient Active Problem List   Diagnosis Date Noted  . Bipolar disorder 01/13/2014    Priority: High  . STUTTERING 08/24/2009    Priority: High  . Snoring 05/08/2007    Priority: Low  . SKIN CANCER, HX OF 11/20/2006    Priority: Low   Medications- reviewed and updated Current Outpatient Prescriptions  Medication Sig Dispense Refill  . lamoTRIgine (LAMICTAL) 25 MG tablet Take 1 tablet (25 mg total) by mouth daily. Take 1 pill for 2 weeks, then take 2 pills for 2 weeks, then 4 pills for 1 week then see Dr. Yong Channel 70 tablet 1  . LORazepam (ATIVAN) 0.5 MG tablet TAKE ONE TABLET BY MOUTH EVERY 8 HOURS AS NEEDED 30 tablet 1  . divalproex (DEPAKOTE ER) 500 MG 24 hr tablet Take 3 tablets (1,500 mg total) by mouth daily. 90 tablet 2   No current facility-administered medications for this visit.    Objective: BP 128/72 mmHg  Pulse 76  Temp(Src) 98.1 F (36.7 C)  Wt 237 lb (107.502 kg) Gen: NAD, resting comfortably CV: RRR no murmurs rubs or gallops Lungs: CTAB no crackles, wheeze, rhonchi Neuro: grossly normal, moves all extremities , stutters per baseline Psych: not depressed, not anxious or irritable.    Assessment/Plan:  Bipolar disorder Depression has improved with phq9 of 7 down from 15 with restarting lamictal. History of mania as well so really need to get patient on depakote as well-we will restart at 1500mg  today. He will follow up in 6 weeks. given red flags for  return.    Meds ordered this encounter  Medications  . divalproex (DEPAKOTE ER) 500 MG 24 hr tablet    Sig: Take 3 tablets (1,500 mg total) by mouth daily.    Dispense:  90 tablet    Refill:  2  . lamoTRIgine (LAMICTAL) 25 MG tablet    Sig: Take 4 tablets (100 mg total) by mouth daily.    Dispense:  120 tablet    Refill:  3

## 2014-04-16 ENCOUNTER — Ambulatory Visit: Payer: Self-pay | Admitting: Family Medicine

## 2014-10-09 ENCOUNTER — Ambulatory Visit (INDEPENDENT_AMBULATORY_CARE_PROVIDER_SITE_OTHER): Payer: 59 | Admitting: Family Medicine

## 2014-10-09 ENCOUNTER — Encounter: Payer: Self-pay | Admitting: Family Medicine

## 2014-10-09 VITALS — BP 130/100 | HR 69 | Temp 97.9°F | Ht 72.0 in | Wt 234.0 lb

## 2014-10-09 DIAGNOSIS — Z7251 High risk heterosexual behavior: Secondary | ICD-10-CM

## 2014-10-09 DIAGNOSIS — R030 Elevated blood-pressure reading, without diagnosis of hypertension: Secondary | ICD-10-CM

## 2014-10-09 DIAGNOSIS — IMO0001 Reserved for inherently not codable concepts without codable children: Secondary | ICD-10-CM

## 2014-10-09 DIAGNOSIS — Z0000 Encounter for general adult medical examination without abnormal findings: Secondary | ICD-10-CM | POA: Diagnosis not present

## 2014-10-09 DIAGNOSIS — F3175 Bipolar disorder, in partial remission, most recent episode depressed: Secondary | ICD-10-CM

## 2014-10-09 LAB — CBC
HEMATOCRIT: 44.4 % (ref 39.0–52.0)
HEMOGLOBIN: 14.9 g/dL (ref 13.0–17.0)
MCHC: 33.6 g/dL (ref 30.0–36.0)
MCV: 88.4 fl (ref 78.0–100.0)
Platelets: 217 10*3/uL (ref 150.0–400.0)
RBC: 5.02 Mil/uL (ref 4.22–5.81)
RDW: 12.5 % (ref 11.5–15.5)
WBC: 6 10*3/uL (ref 4.0–10.5)

## 2014-10-09 LAB — COMPREHENSIVE METABOLIC PANEL
ALT: 52 U/L (ref 0–53)
AST: 38 U/L — AB (ref 0–37)
Albumin: 4.9 g/dL (ref 3.5–5.2)
Alkaline Phosphatase: 71 U/L (ref 39–117)
BILIRUBIN TOTAL: 0.8 mg/dL (ref 0.2–1.2)
BUN: 12 mg/dL (ref 6–23)
CO2: 29 mEq/L (ref 19–32)
CREATININE: 1.04 mg/dL (ref 0.40–1.50)
Calcium: 9.7 mg/dL (ref 8.4–10.5)
Chloride: 104 mEq/L (ref 96–112)
GFR: 83.12 mL/min (ref >60.00)
Glucose, Bld: 91 mg/dL (ref 70–99)
Potassium: 3.8 mEq/L (ref 3.5–5.1)
Sodium: 141 mEq/L (ref 135–145)
Total Protein: 7.7 g/dL (ref 6.0–8.3)

## 2014-10-09 LAB — LIPID PANEL
CHOL/HDL RATIO: 5
Cholesterol: 176 mg/dL (ref 0–200)
HDL: 35.4 mg/dL — AB (ref >39.00)
LDL Cholesterol: 120 mg/dL — ABNORMAL HIGH (ref 0–99)
NONHDL: 141.07
TRIGLYCERIDES: 103 mg/dL (ref 0.0–149.0)
VLDL: 20.6 mg/dL (ref 0.0–40.0)

## 2014-10-09 LAB — TSH: TSH: 1.06 u[IU]/mL (ref 0.35–4.50)

## 2014-10-09 NOTE — Patient Instructions (Addendum)
Medication Instructions:  No changes today  Other Instructions:  I am glad your depression is doing better and I think meeting with your pastor is great. My concern is that cymbalta does not really treat bipolar. I would advise you to at least meet with a psychiatrist once before continuing with Dr. Maudie Mercury on the cymbalta alone.   See DASH eating plan below and try to adhere. Try to increase exercise to 3-4 days a week. Great job on 3 lbs down! Could consider getting a home cuff as well and checking about twice a week. If your blood pressure remains up by follow up, may need blood pressure medicine support  Labwork: Labs before you leave  Testing/Procedures/Immunizations: Return to flu clinic in October for flu shot  Follow-Up: 3 months follow up      [place] stands for "Dietary Approaches to Stop Hypertension." The DASH eating plan is a healthy eating plan that has been shown to reduce high blood pressure (hypertension). Additional health benefits may include reducing the risk of type 2 diabetes mellitus, heart disease, and stroke. The DASH eating plan may also help with weight loss. WHAT DO I NEED TO KNOW ABOUT THE DASH EATING PLAN? For the DASH eating plan, you will follow these general guidelines:  Choose foods with a percent daily value for sodium of less than 5% (as listed on the food label).  Use salt-free seasonings or herbs instead of table salt or sea salt.  Check with your health care provider or pharmacist before using salt substitutes.  Eat lower-sodium products, often labeled as "lower sodium" or "no salt added."  Eat fresh foods.  Eat more vegetables, fruits, and low-fat dairy products.  Choose whole grains. Look for the word "whole" as the first word in the ingredient list.  Choose fish and skinless chicken or [place] more often than red meat. Limit fish, poultry, and meat to 6 oz (170 g) each day.  Limit sweets, desserts, sugars, and sugary  drinks.  Choose heart-healthy fats.  Limit cheese to 1 oz (28 g) per day.  Eat more home-cooked food and less restaurant, buffet, and fast food.  Limit fried foods.  Cook foods using methods other than frying.  Limit canned vegetables. If you do use them, rinse them well to decrease the sodium.  When eating at a restaurant, ask that your food be prepared with less salt, or no salt if possible. WHAT FOODS CAN I EAT? Seek help from a dietitian for individual calorie needs. Grains Whole grain or whole wheat bread. Brown rice. Whole grain or whole wheat pasta. Quinoa, bulgur, and whole grain cereals. Low-sodium cereals. Corn or whole wheat flour tortillas. Whole grain cornbread. Whole grain crackers. Low-sodium crackers. Vegetables Fresh or frozen vegetables (raw, steamed, roasted, or grilled). Low-sodium or reduced-sodium tomato and vegetable juices. Low-sodium or reduced-sodium tomato sauce and paste. Low-sodium or reduced-sodium canned vegetables.  Fruits All fresh, canned (in natural juice), or frozen fruits. Meat and Other Protein Products Ground beef (85% or leaner), grass-fed beef, or beef trimmed of fat. Skinless chicken or [place]. Ground chicken or [place]. Pork trimmed of fat. All fish and seafood. Eggs. Dried beans, peas, or lentils. Unsalted nuts and seeds. Unsalted canned beans. Dairy Low-fat dairy products, such as skim or 1% milk, 2% or reduced-fat cheeses, low-fat ricotta or cottage cheese, or plain low-fat yogurt. Low-sodium or reduced-sodium cheeses. Fats and Oils Tub margarines without trans fats. Light or reduced-fat mayonnaise and salad dressings (reduced sodium). Avocado. Safflower,  olive, or canola oils. Natural peanut or almond butter. Other Unsalted popcorn and pretzels. The items listed above may not be a complete list of recommended foods or beverages. Contact your dietitian for more options. WHAT FOODS ARE NOT RECOMMENDED? Grains White bread. White pasta.  White rice. Refined cornbread. Bagels and croissants. Crackers that contain trans fat. Vegetables Creamed or fried vegetables. Vegetables in a cheese sauce. Regular canned vegetables. Regular canned tomato sauce and paste. Regular tomato and vegetable juices. Fruits Dried fruits. Canned fruit in light or heavy syrup. Fruit juice. Meat and Other Protein Products Fatty cuts of meat. Ribs, chicken wings, bacon, sausage, bologna, salami, chitterlings, fatback, hot dogs, bratwurst, and packaged luncheon meats. Salted nuts and seeds. Canned beans with salt. Dairy Whole or 2% milk, cream, half-and-half, and cream cheese. Whole-fat or sweetened yogurt. Full-fat cheeses or blue cheese. Nondairy creamers and whipped toppings. Processed cheese, cheese spreads, or cheese curds. Condiments Onion and garlic salt, seasoned salt, table salt, and sea salt. Canned and packaged gravies. Worcestershire sauce. Tartar sauce. Barbecue sauce. Teriyaki sauce. Soy sauce, including reduced sodium. Steak sauce. Fish sauce. Oyster sauce. Cocktail sauce. Horseradish. Ketchup and mustard. Meat flavorings and tenderizers. Bouillon cubes. Hot sauce. Tabasco sauce. Marinades. Taco seasonings. Relishes. Fats and Oils Butter, stick margarine, lard, shortening, ghee, and bacon fat. Coconut, palm kernel, or palm oils. Regular salad dressings. Other Pickles and olives. Salted popcorn and pretzels. The items listed above may not be a complete list of foods and beverages to avoid. Contact your dietitian for more information. WHERE CAN I FIND MORE INFORMATION? National Heart, Lung, and Blood Institute: travelstabloid.com Document Released: 02/03/2011 Document Revised: 07/01/2013 Document Reviewed: 12/19/2012 [facility] ([facility]) Patient Information 2015 [place], [place]. This information is not intended to replace advice given to you by your health care provider. Make sure you discuss any questions you have with your  health care provider.

## 2014-10-09 NOTE — Assessment & Plan Note (Signed)
S: 1st elevation in 3 years.  BP Readings from Last 3 Encounters:  10/09/14 130/100  03/03/14 128/72  01/13/14 122/84  A/P:We will monitor with sooner follow up. Dash diet and eercise increase with continued weight loss effort.

## 2014-10-09 NOTE — Assessment & Plan Note (Signed)
S:in January started depakote 1500 mg and continued lamictal 100mg  daily. Plan was to get plugged back into psych- we gave short bridge due to financial issues. He saw a doctor at [facility] and was given samples of cymbalta. Patient states he decided to go in a "different direction" and has not had manic episodes/highs in years and years. Meeting with Doristine Bosworth twice a month, more frequently at first. Feels like he is in a good place. PHQ2 of 0.  A/P: With reported history of bipolar I, I warned patient that I thought decision to change meds was best determined by psych especially decision. He is happy with his current therapy as prescribed by [facility] . He will consider my advice but plans to continue current therapy

## 2014-10-09 NOTE — Progress Notes (Signed)
Caleb Reddish, MD Phone: [phone]  Subjective:  Patient presents today for their annual physical. Chief complaint-noted.   Overall doing well See problem oriented charting  ROS- full  review of systems was completed and negative except for: Feels warmer than normal and occasional headaches-gradually progressive over years but very gradual, worse with stress and has been stressed lately, otherwise negative  The following were reviewed and entered/updated in epic: Past Medical History  Diagnosis Date  . Depression   . History of skin cancer   . Stutter    Patient Active Problem List   Diagnosis Date Noted  . Bipolar disorder 01/13/2014    Priority: High  . STUTTERING 08/24/2009    Priority: High  . Snoring 05/08/2007    Priority: Low  . SKIN CANCER, HX OF 11/20/2006    Priority: Low  . Elevated blood pressure 10/09/2014   Past Surgical History  Procedure Laterality Date  . Mole bx    . Arm fx    . Hernia repair      61 mo old    Family History  Problem Relation Age of Onset  . Hyperlipidemia Mother     father  . Hypertension Mother     father  . Depression Father   . Bipolar disorder Neg Hx     Medications- reviewed and updated Current Outpatient Prescriptions  Medication Sig Dispense Refill  . DULoxetine (CYMBALTA) 60 MG capsule Take 60 mg by mouth daily.     No current facility-administered medications for this visit.    Allergies-reviewed and updated No Known Allergies  Social History   Social History  . Marital Status: Married    Spouse Name: N/A  . Number of Children: N/A  . Years of Education: N/A   Social History Main Topics  . Smoking status: Never Smoker   . Smokeless tobacco: Never Used  . Alcohol Use: No  . Drug Use: No  . Sexual Activity: Not Asked   Other Topics Concern  . None   Social History Narrative   Married. 2 kids.       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       Hobbies: yardwork,  basketball    ROS--See HPI   Objective: BP 130/100 mmHg  Pulse 69  Temp(Src) 97.9 F (36.6 C)  Ht 6' (1.829 m)  Wt 234 lb (106.142 kg)  BMI 31.73 kg/m2 Gen: NAD, resting comfortably HEENT: Mucous membranes are moist. Oropharynx normal Neck: no thyromegaly CV: RRR no murmurs rubs or gallops Lungs: CTAB no crackles, wheeze, rhonchi Abdomen: soft/nontender/nondistended/normal bowel sounds. No rebound or guarding.  No rectal Ext: no edema Skin: warm, dry  Neuro: grossly normal, moves all extremities, PERRLA   Assessment/Plan:  44 y.o. male presenting for annual physical.  Health Maintenance counseling: 1. Anticipatory guidance: Patient counseled regarding regular dental exams, wearing seatbelts, wearing sunscreen 2. Risk factor reduction:  Advised patient of need for regular exercise (walking 3 days a week) and diet rich and fruits and vegetables to reduce risk of heart attack and stroke (eating at home more and working on reducing junk food, struggling to get off diet drinks) 3. Immunizations/screenings/ancillary studies Health Maintenance Due  Topic Date Due  . HIV Screening - advised today with labs 06/22/1987  . INFLUENZA VACCINE - in October, gets where he works 09/29/2014  4. Full skin exam due to history melanoma in father- patient is going to follow up with derm- he has several atypical moles that have  not changed recently but we agreed dermatology eyes would be a good idea in his case 5. Prostate cancer screening not indicated at age and no history prostate cancer 6. Discussed plan colonoscopy at 52   Bipolar disorder S:in January started depakote 1500 mg and continued lamictal 100mg  daily. Plan was to get plugged back into psych- we gave short bridge due to financial issues. He saw a doctor at [facility] and was given samples of cymbalta. Patient states he decided to go in a "different direction" and has not had manic episodes/highs in years and years. Meeting  with Doristine Bosworth twice a month, more frequently at first. Feels like he is in a good place. PHQ2 of 0.  A/P: With reported history of bipolar I, I warned patient that I thought decision to change meds was best determined by psych especially decision. He is happy with his current therapy as prescribed by [facility] . He will consider my advice but plans to continue current therapy   Elevated blood pressure S: 1st elevation in 3 years.  BP Readings from Last 3 Encounters:  10/09/14 130/100  03/03/14 128/72  01/13/14 122/84  A/P:We will monitor with sooner follow up. Dash diet and eercise increase with continued weight loss effort.   3 month BP check- check in on decision for bipolar treatment as well  Fasting  Orders Placed This Encounter  Procedures  . CBC    [place]  . Comprehensive metabolic panel    [facility]    Order Specific Question:  Has the patient fasted?    Answer:  No  . Lipid panel    [facility]    Order Specific Question:  Has the patient fasted?    Answer:  No  . TSH    [facility]  . HIV antibody    [facility]    Meds ordered this encounter  Medications  . DULoxetine (CYMBALTA) 60 MG capsule    Sig: Take 60 mg by mouth daily.

## 2014-10-10 LAB — HIV ANTIBODY (ROUTINE TESTING W REFLEX): HIV: NONREACTIVE

## 2014-11-02 ENCOUNTER — Emergency Department (HOSPITAL_BASED_OUTPATIENT_CLINIC_OR_DEPARTMENT_OTHER)
Admission: EM | Admit: 2014-11-02 | Discharge: 2014-11-02 | Disposition: A | Discharge status: Home / Self Care | Payer: 59 | Attending: Emergency Medicine | Admitting: Emergency Medicine

## 2014-11-02 ENCOUNTER — Encounter (HOSPITAL_BASED_OUTPATIENT_CLINIC_OR_DEPARTMENT_OTHER): Payer: Self-pay | Admitting: Emergency Medicine

## 2014-11-02 DIAGNOSIS — S39012A Strain of muscle, fascia and tendon of lower back, initial encounter: Secondary | ICD-10-CM | POA: Diagnosis not present

## 2014-11-02 DIAGNOSIS — Y9389 Activity, other specified: Secondary | ICD-10-CM | POA: Diagnosis not present

## 2014-11-02 DIAGNOSIS — Z85828 Personal history of other malignant neoplasm of skin: Secondary | ICD-10-CM | POA: Insufficient documentation

## 2014-11-02 DIAGNOSIS — Y9289 Other specified places as the place of occurrence of the external cause: Secondary | ICD-10-CM | POA: Insufficient documentation

## 2014-11-02 DIAGNOSIS — Z79899 Other long term (current) drug therapy: Secondary | ICD-10-CM | POA: Insufficient documentation

## 2014-11-02 DIAGNOSIS — S3992XA Unspecified injury of lower back, initial encounter: Secondary | ICD-10-CM | POA: Diagnosis present

## 2014-11-02 DIAGNOSIS — X58XXXA Exposure to other specified factors, initial encounter: Secondary | ICD-10-CM | POA: Insufficient documentation

## 2014-11-02 DIAGNOSIS — F329 Major depressive disorder, single episode, unspecified: Secondary | ICD-10-CM | POA: Diagnosis not present

## 2014-11-02 DIAGNOSIS — Y998 Other external cause status: Secondary | ICD-10-CM | POA: Diagnosis not present

## 2014-11-02 MED ORDER — CYCLOBENZAPRINE HCL 10 MG PO TABS: 10.0000 mg | ORAL_TABLET | Freq: Three times a day (TID) | ORAL | Status: DC | PRN

## 2014-11-02 NOTE — Discharge Instructions (Signed)
You may continue taking Aleve for your pain. Alternate ice and heat intermittently tearful back. Avoid heavy lifting or hard physical activity for the next few days. Take flexeril as directed for muscle spasm. No driving or operating heavy machinery while taking flexeril. This medication may make you drowsy.  Lumbosacral Strain Lumbosacral strain is a strain of any of the parts that make up your lumbosacral vertebrae. Your lumbosacral vertebrae are the bones that make up the lower third of your backbone. Your lumbosacral vertebrae are held together by muscles and tough, fibrous tissue (ligaments).  CAUSES  A sudden blow to your back can cause lumbosacral strain. Also, anything that causes an excessive stretch of the muscles in the low back can cause this strain. This is typically seen when people exert themselves strenuously, fall, lift heavy objects, bend, or crouch repeatedly. RISK FACTORS  Physically demanding work.  Participation in pushing or pulling sports or sports that require a sudden twist of the back (tennis, golf, baseball).  Weight lifting.  Excessive lower back curvature.  Forward-tilted pelvis.  Weak back or abdominal muscles or both.  Tight hamstrings. SIGNS AND SYMPTOMS  Lumbosacral strain may cause pain in the area of your injury or pain that moves (radiates) down your leg.  DIAGNOSIS Your health care provider can often diagnose lumbosacral strain through a physical exam. In some cases, you may need tests such as X-ray exams.  TREATMENT  Treatment for your lower back injury depends on many factors that your clinician will have to evaluate. However, most treatment will include the use of anti-inflammatory medicines. HOME CARE INSTRUCTIONS   Avoid hard physical activities (tennis, racquetball, waterskiing) if you are not in proper physical condition for it. This may aggravate or create problems.  If you have a back problem, avoid sports requiring sudden body movements.  Swimming and walking are generally safer activities.  Maintain good posture.  Maintain a healthy weight.  For acute conditions, you may put ice on the injured area.  Put ice in a plastic bag.  Place a towel between your skin and the bag.  Leave the ice on for 20 minutes, 2-3 times a day.  When the low back starts healing, stretching and strengthening exercises may be recommended. SEEK MEDICAL CARE IF:  Your back pain is getting worse.  You experience severe back pain not relieved with medicines. SEEK IMMEDIATE MEDICAL CARE IF:   You have numbness, tingling, weakness, or problems with the use of your arms or legs.  There is a change in bowel or bladder control.  You have increasing pain in any area of the body, including your belly (abdomen).  You notice shortness of breath, dizziness, or feel faint.  You feel sick to your stomach (nauseous), are throwing up (vomiting), or become sweaty.  You notice discoloration of your toes or legs, or your feet get very cold. MAKE SURE YOU:   Understand these instructions.  Will watch your condition.  Will get help right away if you are not doing well or get worse. Document Released: 11/24/2004 Document Revised: 02/19/2013 Document Reviewed: 10/03/2012 [facility] Patient Information 2015 [place], [place]. This information is not intended to replace advice given to you by your health care provider. Make sure you discuss any questions you have with your health care provider.

## 2014-11-02 NOTE — ED Notes (Signed)
C/o back pain that started thur am and has gotten worse through out weekend

## 2014-11-02 NOTE — ED Provider Notes (Signed)
CSN: [id number]     Arrival date & time 11/02/14  1243 History   First MD Initiated Contact with Patient 11/02/14 1245     Chief Complaint  Patient presents with  . Back Pain     (Consider location/radiation/quality/duration/timing/severity/associated sxs/prior Treatment) HPI Comments: 44 year old male presenting with gradual onset of low back pain after carrying around his 28-year-old child 3 nights ago. Pain has gradually worsened, radiates across his lower back and into the lateral aspect of his left leg rated 8/10 at worst. Pain relieved by Aleve and worsened by certain movements. Denies numbness or tingling down extremities, no loss of control of bowels or bladder saddle anesthesia. No fever, chills or night sweats. Reports a pulled muscle in his back a couple years ago and this feels similar.  Patient is a 44 y.o. male presenting with back pain. The history is provided by the spouse and the patient.  Back Pain   Past Medical History  Diagnosis Date  . Depression   . History of skin cancer   . Stutter    Past Surgical History  Procedure Laterality Date  . Mole bx    . Arm fx    . Hernia repair      34 mo old   Family History  Problem Relation Age of Onset  . Hyperlipidemia Mother     father  . Hypertension Mother     father  . Depression Father   . Bipolar disorder Neg Hx    Social History  Substance Use Topics  . Smoking status: Never Smoker   . Smokeless tobacco: Never Used  . Alcohol Use: No    Review of Systems  Musculoskeletal: Positive for back pain.  All other systems reviewed and are negative.     Allergies  Review of patient's allergies indicates no known allergies.  Home Medications   Prior to Admission medications   Medication Sig Start Date End Date Taking? Authorizing Provider  DULoxetine (CYMBALTA) 60 MG capsule Take 60 mg by mouth daily.   Yes Historical Provider, MD  cyclobenzaprine (FLEXERIL) 10 MG tablet Take 1 tablet (10 mg total) by  mouth every 8 (eight) hours as needed for muscle spasms. 11/02/14   Jamez Ambrocio M Delaine Hernandez, PA-C   BP 132/83 mmHg  Pulse 101  Temp(Src) 98.3 F (36.8 C) (Oral)  Resp 18  Ht 6' (1.829 m)  Wt 225 lb (102.059 kg)  BMI 30.51 kg/m2  SpO2 100% Physical Exam  Constitutional: He is oriented to person, place, and time. He appears well-developed and well-nourished. No distress.  HENT:  Head: Normocephalic and atraumatic.  Mouth/Throat: Oropharynx is clear and moist.  Eyes: Conjunctivae are normal.  Neck: Normal range of motion. Neck supple. No spinous process tenderness and no muscular tenderness present.  Cardiovascular: Normal rate, regular rhythm and normal heart sounds.   Pulmonary/Chest: Effort normal and breath sounds normal. No respiratory distress.  Musculoskeletal: He exhibits no edema.  TTP BL lumbar paraspinal muscles with spasm. No spinous process tenderness. FROM.  Neurological: He is alert and oriented to person, place, and time. He has normal strength.  Strength lower extremities 5/5 and equal bilateral. Sensation intact. Normal gait.  Skin: Skin is warm and dry. No rash noted. He is not diaphoretic.  Psychiatric: He has a normal mood and affect. His behavior is normal.  Nursing note and vitals reviewed.   ED Course  Procedures (including critical care time) Labs Review Labs Reviewed - No data to display  Imaging Review No  results found. I have personally reviewed and evaluated these images and lab results as part of my medical decision-making.   EKG Interpretation None      MDM   Final diagnoses:  Lumbar strain, initial encounter   NAD. AFVSS (noted to be tachycardic on arrival vitals at 101, no tachycardia on my exam). No red flags concerning patient's back pain. No s/s of central cord compression or cauda equina. Lower extremities are neurovascularly intact and patient is ambulating without difficulty. No bony tenderness. Imaging studies not warranted at this time.  Advised rest, ice/heat, continue NSAIDs. Flexeril for muscle spasm. Follow-up with PCP. Stable for discharge. Return precautions given. Patient states understanding of treatment care plan and is agreeable.    Carman Ching, PA-C 11/02/14 [place], MD 11/02/14 1356

## 2015-01-09 ENCOUNTER — Ambulatory Visit: Payer: 59 | Admitting: Family Medicine

## 2019-06-17 NOTE — Progress Notes (Signed)
Phone: [phone]    Subjective:  Patient presents today for their annual physical and to reestablish care. Chief complaint-noted.   See problem oriented charting- Review of Systems  Constitutional: Negative for chills and fever.  HENT: Negative for ear pain, hearing loss and tinnitus.   Eyes: Negative for blurred vision, double vision and photophobia.  Respiratory: Negative for cough, shortness of breath and wheezing.   Cardiovascular: Negative for chest pain, palpitations and leg swelling.  Gastrointestinal: Negative for abdominal pain, blood in stool, constipation, diarrhea, heartburn, nausea and vomiting.  Genitourinary: Negative for dysuria, frequency, hematuria and urgency.  Musculoskeletal: Negative for back pain, joint pain and neck pain.  Skin: Positive for rash.       Small red itchy areas cream helps with on legs   Neurological: Negative for dizziness, weakness and headaches.  Endo/Heme/Allergies: Does not bruise/bleed easily.  Psychiatric/Behavioral: Negative for depression, hallucinations and suicidal ideas. The patient does not have insomnia.    The following were reviewed and entered/updated in epic: Past Medical History:  Diagnosis Date  . Depression   . History of skin cancer   . Stutter    Patient Active Problem List   Diagnosis Date Noted  . Bipolar disorder ([place]) 01/13/2014    Priority: High  . STUTTERING 08/24/2009    Priority: High  . Hyperlipidemia, unspecified 06/18/2019    Priority: Medium  . Snoring 05/08/2007    Priority: Low  . SKIN CANCER, HX OF 11/20/2006    Priority: Low   Past Surgical History:  Procedure Laterality Date  . arm fx    . HERNIA REPAIR     48 mo old  . mole bx      Family History  Problem Relation Age of Onset  . Hyperlipidemia Mother        father  . Hypertension Mother        father  . Depression Father   . Melanoma Father   . Lung cancer Father   . Bipolar disorder Neg Hx     Medications- reviewed and  updated Current Outpatient Medications  Medication Sig Dispense Refill  . triamcinolone cream (KENALOG) 0.1 % Apply topically.    . benztropine (COGENTIN) 0.5 MG tablet Take 0.5 mg by mouth 2 (two) times daily.    . clonazePAM (KLONOPIN) 1 MG tablet Take 1 mg by mouth at bedtime.    . lamoTRIgine (LAMICTAL) 25 MG tablet Take 25 mg by mouth 2 (two) times daily.    Marland Kitchen LATUDA 60 MG TABS Take 60 mg by mouth daily.    . sertraline (ZOLOFT) 100 MG tablet Take 100 mg by mouth daily.     No current facility-administered medications for this visit.    Allergies-reviewed and updated No Known Allergies  Social History   Social History Narrative   Married. 2 kids 33 and 14 in 2021.       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       Hobbies: yardwork, basketball      Objective:  BP 120/62   Pulse 72   Temp 98.6 F (37 C) (Temporal)   Ht 6' (1.829 m)   Wt 243 lb 9.6 oz (110.5 kg)   SpO2 96%   BMI 33.04 kg/m  Gen: NAD, resting comfortably HEENT: Mucous membranes are moist. Oropharynx normal Neck: no thyromegaly CV: RRR no murmurs rubs or gallops Lungs: CTAB no crackles, wheeze, rhonchi Abdomen: soft/nontender/nondistended/normal  bowel sounds. No rebound or guarding.  Ext: no edema Skin: warm, dry Neuro: grossly normal, moves all extremities, PERRLA    Assessment and Plan:  49 y.o. male presenting for annual physical.  Health Maintenance counseling: 1. Anticipatory guidance: Patient counseled regarding regular dental exams q6 months, eye exams yearly,  avoiding smoking and second hand smoke- yes  , limiting alcohol to 2 beverages per day. Does not drink at all.    2. Risk factor reduction:  Advised patient of need for regular exercise and diet rich and fruits and vegetables to reduce risk of heart attack and stroke. Exercise- Does not exercise. Very active at work and home. Does take stairs at work. Encouraged him  to start regular exercise- member of gym but needs to go more frequently- twice in last year. Diet-Limits snacks - down 9 lbs from 2016- just doing his 3 melas and cuts out the snacking. Does do some diet drinks and e thinks if he could come off that could lose more weight.  Wt Readings from Last 3 Encounters:  06/18/19 243 lb 9.6 oz (110.5 kg)  11/02/14 225 lb (102.1 kg)  10/09/14 234 lb (106.1 kg)  3. Immunizations/screenings/ancillary studies-Tdap will get in office today.  Also discussed COVID-19 vaccination and over 2 weeks ago-  will call office and give dates received in February  Immunization History  Administered Date(s) Administered  . Influenza-Unspecified 12/07/2013, 12/01/2016, 12/06/2018  . Td 11/20/2006  4. Prostate cancer screening-  No early family history, start at age 66. Paternal grandfather had prostate cancer late in life 33. Colon cancer screening - we discussed newer guidelines suggesting colonoscopy starting age 38.  Not all insurances will cover this-we will place referral and he can check with GI about coverage 6. Skin cancer screening/prevention-Father with history of melanoma-recommended dermatology visits yearly-patient reports has visit tomorrow.   7. Testicular cancer screening- advised monthly self exams  8. STD screening- patient opts out as monogamous 9.  Never smoker-  Father Passed in September of lung cancer.   Status of chronic or acute concerns   #Reestablish care-patient last seen in 2016.  He denies any significant changes in his health history  - lost father recently- has decided wants to take more active role in health  # Bipolar disorder S:Compliant with Latuda 60 mg, Cogentin 0.5 mg twice daily, sertraline 100 mg, Lamictal 25 mg twice a day through psychiatry.  Also uses clonazepam at bedtime for sleep A/P: Reasonable control-continue close follow-up with psychiatry.  #hyperlipidemia S: Not currently on medication Lab Results  Component Value  Date   CHOL 176 10/09/2014   HDL 35.40 (L) 10/09/2014   LDLCALC 120 (H) 10/09/2014   TRIG 103.0 10/09/2014   CHOLHDL 5 10/09/2014   A/P: We will update lipid panel today.  We discussed typically we consider medicine for cholesterol her 10-year risk of heart attack or stroke is above 7.5% and strongly recommend about 20%.  # rash-rash started about a year ago when he was at [employer].  He notes some mild redness and pruritus over inner lower leg with mild redness.  The front of his shin is rather dry as well.  Lotion tends to help. -On exam some flakiness on the shin noted and some mild erythema on the medial portion of the lower leg with slight excoriation -Recommended when area is red try hydrocortisone 1% over-the-counter for 7 days twice a day -Other than that would recommend twice a day Vaseline or Eucerin to keep area  lubricated-if he has worsening symptoms would like for him to come back to see [place] or see dermatology (sees dermatology tomorrow)   Recommended follow up: Return in about 1 year (around 06/17/2020) for physical or sooner if needed.  Lab/Order associations: fasting   ICD-10-CM   1. Preventative health care  Z00.00 CBC with Differential/Platelet    Comprehensive metabolic panel    Lipid panel    TSH  2. Bipolar disorder, in partial remission, most recent episode depressed ([place])  F31.75 CBC with Differential/Platelet    Comprehensive metabolic panel  3. Hyperlipidemia, unspecified hyperlipidemia type  E78.5 CBC with Differential/Platelet    Comprehensive metabolic panel    Lipid panel    TSH  4. Screen for colon cancer  Z12.11 Ambulatory referral to Gastroenterology    No orders of the defined types were placed in this encounter.   Return precautions advised.   Garret Reddish, MD

## 2019-06-18 ENCOUNTER — Encounter: Payer: Self-pay | Admitting: Family Medicine

## 2019-06-18 ENCOUNTER — Ambulatory Visit (INDEPENDENT_AMBULATORY_CARE_PROVIDER_SITE_OTHER): Payer: 59 | Admitting: Family Medicine

## 2019-06-18 ENCOUNTER — Other Ambulatory Visit: Payer: Self-pay

## 2019-06-18 VITALS — BP 120/62 | HR 72 | Temp 98.6°F | Ht 72.0 in | Wt 243.6 lb

## 2019-06-18 DIAGNOSIS — Z1211 Encounter for screening for malignant neoplasm of colon: Secondary | ICD-10-CM | POA: Diagnosis not present

## 2019-06-18 DIAGNOSIS — E785 Hyperlipidemia, unspecified: Secondary | ICD-10-CM | POA: Diagnosis not present

## 2019-06-18 DIAGNOSIS — Z0000 Encounter for general adult medical examination without abnormal findings: Secondary | ICD-10-CM | POA: Diagnosis not present

## 2019-06-18 DIAGNOSIS — F3175 Bipolar disorder, in partial remission, most recent episode depressed: Secondary | ICD-10-CM | POA: Diagnosis not present

## 2019-06-18 DIAGNOSIS — Z23 Encounter for immunization: Secondary | ICD-10-CM

## 2019-06-18 LAB — CBC WITH DIFFERENTIAL/PLATELET
Basophils Absolute: 0.1 10*3/uL (ref 0.0–0.1)
Basophils Relative: 1.9 % (ref 0.0–3.0)
Eosinophils Absolute: 0.2 10*3/uL (ref 0.0–0.7)
Eosinophils Relative: 3.8 % (ref 0.0–5.0)
HCT: 43.3 % (ref 39.0–52.0)
Hemoglobin: 14.7 g/dL (ref 13.0–17.0)
Lymphocytes Relative: 28.4 % (ref 12.0–46.0)
Lymphs Abs: 1.8 10*3/uL (ref 0.7–4.0)
MCHC: 33.9 g/dL (ref 30.0–36.0)
MCV: 87.8 fl (ref 78.0–100.0)
Monocytes Absolute: 0.5 10*3/uL (ref 0.1–1.0)
Monocytes Relative: 7.3 % (ref 3.0–12.0)
Neutro Abs: 3.7 10*3/uL (ref 1.4–7.7)
Neutrophils Relative %: 58.6 % (ref 43.0–77.0)
Platelets: 206 10*3/uL (ref 150.0–400.0)
RBC: 4.94 Mil/uL (ref 4.22–5.81)
RDW: 13.2 % (ref 11.5–15.5)
WBC: 6.2 10*3/uL (ref 4.0–10.5)

## 2019-06-18 LAB — COMPREHENSIVE METABOLIC PANEL
ALT: 32 U/L (ref 0–53)
AST: 22 U/L (ref 0–37)
Albumin: 4.6 g/dL (ref 3.5–5.2)
Alkaline Phosphatase: 64 U/L (ref 39–117)
BUN: 10 mg/dL (ref 6–23)
CO2: 28 mEq/L (ref 19–32)
Calcium: 9.3 mg/dL (ref 8.4–10.5)
Chloride: 103 mEq/L (ref 96–112)
Creatinine, Ser: 1.14 mg/dL (ref 0.40–1.50)
GFR: 68.86 mL/min (ref >60.00)
Glucose, Bld: 97 mg/dL (ref 70–99)
Potassium: 3.9 mEq/L (ref 3.5–5.1)
Sodium: 139 mEq/L (ref 135–145)
Total Bilirubin: 0.5 mg/dL (ref 0.2–1.2)
Total Protein: 6.9 g/dL (ref 6.0–8.3)

## 2019-06-18 LAB — LIPID PANEL
Cholesterol: 190 mg/dL (ref 0–200)
HDL: 34.2 mg/dL — ABNORMAL LOW (ref >39.00)
LDL Cholesterol: 132 mg/dL — ABNORMAL HIGH (ref 0–99)
NonHDL: 155.3
Total CHOL/HDL Ratio: 6
Triglycerides: 118 mg/dL (ref 0.0–149.0)
VLDL: 23.6 mg/dL (ref 0.0–40.0)

## 2019-06-18 LAB — TSH: TSH: 2.75 u[IU]/mL (ref 0.35–4.50)

## 2019-06-18 NOTE — Patient Instructions (Addendum)
Health Maintenance Due  Topic Date Due  . COVID-19 Vaccine (1) will call with dates.  Never done  . TETANUS/TDAP  Will get today  11/19/2016   -Recommended when area is red try hydrocortisone 1% over-the-counter for 7 days twice a day -Other than that would recommend twice a day Vaseline or Eucerin to keep area lubricated-if he has worsening symptoms would like for him to come back to see [place] or see dermatology  We will call you within two weeks about your referral to GI for colonoscopy (make sure to ask about coverage) . If you do not hear within 3 weeks, give [place] a call.    Recommended follow up: Return in about 1 year (around 06/17/2020) for physical or sooner if needed.      Please stop by lab before you go If you do not have mychart- we will call you about results within 5 business days of [place] receiving them.  If you have mychart- we will send your results within 3 business days of [place] receiving them.  If abnormal or we want to clarify a result, we will call or mychart you to make sure you receive the message.  If you have questions or concerns or don't hear within 5 business days, please send [place] a message or call us.

## 2019-06-18 NOTE — Addendum Note (Signed)
Addended by: Francella Solian on: 06/18/2019 09:01 AM   Modules accepted: Orders

## 2019-06-19 ENCOUNTER — Encounter: Payer: Self-pay | Admitting: Internal Medicine

## 2019-07-16 ENCOUNTER — Other Ambulatory Visit: Payer: Self-pay

## 2019-07-16 ENCOUNTER — Ambulatory Visit (AMBULATORY_SURGERY_CENTER): Payer: Self-pay | Admitting: *Deleted

## 2019-07-16 ENCOUNTER — Encounter: Payer: Self-pay | Admitting: Internal Medicine

## 2019-07-16 VITALS — Temp 97.3°F | Ht 72.0 in | Wt 246.0 lb

## 2019-07-16 DIAGNOSIS — Z1211 Encounter for screening for malignant neoplasm of colon: Secondary | ICD-10-CM

## 2019-07-16 MED ORDER — SUTAB 1479-225-188 MG PO TABS: 1.0000 | ORAL_TABLET | ORAL | 0 refills | Status: DC

## 2019-07-16 NOTE — Progress Notes (Signed)
Patient is here in-person for PV. Patient denies any allergies to eggs or soy. Patient denies any problems with anesthesia/sedation. Patient denies any oxygen use at home. Patient denies taking any diet/weight loss medications or blood thinners. Patient is not being treated for MRSA or C-diff. Patient is aware of our care-partner policy and 0000000 safety protocol. EMMI education assisgned to the patient for the procedure, this was explained and instructions given to patient.   Prep Prescription coupon given to the patient.  Completed covid vaccines on 04/12/19 per pt.

## 2019-07-30 ENCOUNTER — Ambulatory Visit (AMBULATORY_SURGERY_CENTER): Payer: 59 | Admitting: Internal Medicine

## 2019-07-30 ENCOUNTER — Other Ambulatory Visit: Payer: Self-pay

## 2019-07-30 ENCOUNTER — Encounter: Payer: Self-pay | Admitting: Internal Medicine

## 2019-07-30 ENCOUNTER — Other Ambulatory Visit: Payer: Self-pay | Admitting: Internal Medicine

## 2019-07-30 VITALS — BP 131/80 | HR 54 | Temp 97.8°F | Resp 15 | Ht 72.0 in | Wt 246.0 lb

## 2019-07-30 DIAGNOSIS — D123 Benign neoplasm of transverse colon: Secondary | ICD-10-CM | POA: Diagnosis not present

## 2019-07-30 DIAGNOSIS — D124 Benign neoplasm of descending colon: Secondary | ICD-10-CM

## 2019-07-30 DIAGNOSIS — Z1211 Encounter for screening for malignant neoplasm of colon: Secondary | ICD-10-CM | POA: Diagnosis present

## 2019-07-30 MED ORDER — SODIUM CHLORIDE 0.9 % IV SOLN
500.0000 mL | INTRAVENOUS | Status: DC
Start: 2019-07-30 — End: 2019-07-30

## 2019-07-30 NOTE — Op Note (Signed)
[facility] Patient Name: Caleb Clayton Procedure Date: 07/30/2019 9:01 AM MRN: [id number] Endoscopist: Jerene Bears , MD Age: 49 Referring MD:  Date of Birth: 16-Dec-1970 Gender: Male Account #: [id number] Procedure:                Colonoscopy Indications:              Screening for colorectal malignant neoplasm, This                            is the patient's first colonoscopy Medicines:                Monitored Anesthesia Care Procedure:                Pre-Anesthesia Assessment:                           - Prior to the procedure, a History and Physical                            was performed, and patient medications and                            allergies were reviewed. The patient's tolerance of                            previous anesthesia was also reviewed. The risks                            and benefits of the procedure and the sedation                            options and risks were discussed with the patient.                            All questions were answered, and informed consent                            was obtained. Prior Anticoagulants: The patient has                            taken no previous anticoagulant or antiplatelet                            agents. ASA Grade Assessment: II - A patient with                            mild systemic disease. After reviewing the risks                            and benefits, the patient was deemed in                            satisfactory condition to undergo the procedure.  After obtaining informed consent, the colonoscope                            was passed under direct vision. Throughout the                            procedure, the patient's blood pressure, pulse, and                            oxygen saturations were monitored continuously. The                            Colonoscope was introduced through the anus and                            advanced to the cecum,  identified by appendiceal                            orifice and ileocecal valve. The colonoscopy was                            performed without difficulty. The patient tolerated                            the procedure well. The quality of the bowel                            preparation was good. The ileocecal valve,                            appendiceal orifice, and rectum were photographed. Scope In: 9:13:19 AM Scope Out: 9:30:53 AM Scope Withdrawal Time: 0 hours 15 minutes 21 seconds  Total Procedure Duration: 0 hours 17 minutes 34 seconds  Findings:                 The digital rectal exam was normal.                           A 8 mm polyp was found in the transverse colon. The                            polyp was semi-pedunculated. The polyp was removed                            with a hot snare. Resection and retrieval were                            complete.                           A 8 mm polyp was found in the proximal descending                            colon. The polyp was pedunculated. The polyp was  removed with a hot snare. Resection and retrieval                            were complete.                           Two sessile polyps were found in the descending                            colon. The polyps were 5 to 6 mm in size. These                            polyps were removed with a cold snare. Resection                            and retrieval were complete.                           Internal hemorrhoids were found during                            retroflexion. The hemorrhoids were small. Complications:            No immediate complications. Estimated Blood Loss:     Estimated blood loss was minimal. Impression:               - One 8 mm polyp in the transverse colon, removed                            with a hot snare. Resected and retrieved.                           - One 8 mm polyp in the proximal descending colon,                             removed with a hot snare. Resected and retrieved.                           - Two 5 to 6 mm polyps in the descending colon,                            removed with a cold snare. Resected and retrieved.                           - Small internal hemorrhoids. Recommendation:           - Patient has a contact number available for                            emergencies. The signs and symptoms of potential                            delayed complications were discussed with the  patient. Return to normal activities tomorrow.                            Written discharge instructions were provided to the                            patient.                           - Resume previous diet.                           - Continue present medications.                           - No aspirin, ibuprofen, naproxen, or other                            non-steroidal anti-inflammatory drugs for 2 weeks                            after polyp removal.                           - Await pathology results.                           - Repeat colonoscopy is recommended. The                            colonoscopy date will be determined after pathology                            results from today's exam become available for                            review. Jerene Bears, MD 07/30/2019 9:33:43 AM This report has been signed electronically.

## 2019-07-30 NOTE — Progress Notes (Signed)
Vs CW I have reviewed the patient's medical history in detail and updated the computerized patient record.   

## 2019-07-30 NOTE — Patient Instructions (Signed)
Handout on polyps and hemorrhoids give. NO aspirin, , ibuprofen, naproxen, or other non-steroidal anti inflammatory drugs for 2 weeks. Take tylenol if needed for pain.    YOU HAD AN ENDOSCOPIC PROCEDURE TODAY AT [facility]:   Refer to the procedure report that was given to you for any specific questions about what was found during the examination.  If the procedure report does not answer your questions, please call your gastroenterologist to clarify.  If you requested that your care partner not be given the details of your procedure findings, then the procedure report has been included in a sealed envelope for you to review at your convenience later.  YOU SHOULD EXPECT: Some feelings of bloating in the abdomen. Passage of more gas than usual.  Walking can help get rid of the air that was put into your GI tract during the procedure and reduce the bloating. If you had a lower endoscopy (such as a colonoscopy or flexible sigmoidoscopy) you may notice spotting of blood in your stool or on the toilet paper. If you underwent a bowel prep for your procedure, you may not have a normal bowel movement for a few days.  Please Note:  You might notice some irritation and congestion in your nose or some drainage.  This is from the oxygen used during your procedure.  There is no need for concern and it should clear up in a day or so.  SYMPTOMS TO REPORT IMMEDIATELY:   Following lower endoscopy (colonoscopy or flexible sigmoidoscopy):  Excessive amounts of blood in the stool  Significant tenderness or worsening of abdominal pains  Swelling of the abdomen that is new, acute  Fever of 100F or higher   For urgent or emergent issues, a gastroenterologist can be reached at any hour by calling [phone]. Do not use MyChart messaging for urgent concerns.    DIET:  We do recommend a small meal at first, but then you may proceed to your regular diet.  Drink plenty of fluids but you should  avoid alcoholic beverages for 24 hours.  ACTIVITY:  You should plan to take it easy for the rest of today and you should NOT DRIVE or use heavy machinery until tomorrow (because of the sedation medicines used during the test).    FOLLOW UP: Our staff will call the number listed on your records 48-72 hours following your procedure to check on you and address any questions or concerns that you may have regarding the information given to you following your procedure. If we do not reach you, we will leave a message.  We will attempt to reach you two times.  During this call, we will ask if you have developed any symptoms of COVID 19. If you develop any symptoms (ie: fever, flu-like symptoms, shortness of breath, cough etc.) before then, please call [phone].  If you test positive for Covid 19 in the 2 weeks post procedure, please call and report this information to [place].    If any biopsies were taken you will be contacted by phone or by letter within the next 1-3 weeks.  Please call us at [phone] if you have not heard about the biopsies in 3 weeks.    SIGNATURES/CONFIDENTIALITY: You and/or your care partner have signed paperwork which will be entered into your electronic medical record.  These signatures attest to the fact that that the information above on your After Visit Summary has been reviewed and is understood.  Full responsibility of the  confidentiality of this discharge information lies with you and/or your care-partner. 

## 2019-07-30 NOTE — Progress Notes (Signed)
Called to room to assist during endoscopic procedure.  Patient ID and intended procedure confirmed with present staff. Received instructions for my participation in the procedure from the performing physician.  

## 2019-07-30 NOTE — Progress Notes (Signed)
Report to PACU, RN, vss, BBS= Clear.  

## 2019-08-01 ENCOUNTER — Telehealth: Payer: Self-pay | Admitting: *Deleted

## 2019-08-01 NOTE — Telephone Encounter (Signed)
First follow up call attempt.  Left message on voicemail.

## 2019-08-01 NOTE — Telephone Encounter (Signed)
  Follow up Call-  Call back number 07/30/2019  Post procedure Call Back phone  # [phone]  Permission to leave phone message Yes  Some recent data might be hidden     Patient questions:  Do you have a fever, pain , or abdominal swelling? No. Pain Score  0 *  Have you tolerated food without any problems? Yes.    Have you been able to return to your normal activities? Yes.    Do you have any questions about your discharge instructions: Diet   No. Medications  No. Follow up visit  No.  Do you have questions or concerns about your Care? No.  Actions: * If pain score is 4 or above: No action needed, pain <4.  1. Have you developed a fever since your procedure? no  2.   Have you had an respiratory symptoms (SOB or cough) since your procedure? no  3.   Have you tested positive for COVID 19 since your procedure no  4.   Have you had any family members/close contacts diagnosed with the COVID 19 since your procedure?  no   If yes to any of these questions please route to Joylene John, RN and Erenest Rasher, RN

## 2019-08-04 ENCOUNTER — Encounter: Payer: Self-pay | Admitting: Internal Medicine

## 2020-06-18 ENCOUNTER — Encounter: Payer: 59 | Admitting: Family Medicine

## 2020-07-14 ENCOUNTER — Encounter: Payer: Self-pay | Admitting: Medical

## 2020-07-14 ENCOUNTER — Other Ambulatory Visit: Payer: Self-pay

## 2020-07-14 ENCOUNTER — Ambulatory Visit: Payer: 59 | Admitting: Medical

## 2020-07-14 VITALS — BP 138/80 | HR 66 | Temp 97.9°F | Resp 18 | Ht 72.0 in | Wt 247.0 lb

## 2020-07-14 DIAGNOSIS — R519 Headache, unspecified: Secondary | ICD-10-CM

## 2020-07-14 DIAGNOSIS — R5383 Other fatigue: Secondary | ICD-10-CM

## 2020-07-14 DIAGNOSIS — R030 Elevated blood-pressure reading, without diagnosis of hypertension: Secondary | ICD-10-CM | POA: Diagnosis not present

## 2020-07-14 DIAGNOSIS — E538 Deficiency of other specified B group vitamins: Secondary | ICD-10-CM

## 2020-07-14 LAB — CBC WITH DIFFERENTIAL/PLATELET
Basophils Absolute: 0.1 10*3/uL (ref 0.0–0.1)
Basophils Relative: 1.3 % (ref 0.0–3.0)
Eosinophils Absolute: 0.2 10*3/uL (ref 0.0–0.7)
Eosinophils Relative: 3.3 % (ref 0.0–5.0)
HCT: 41.9 % (ref 39.0–52.0)
Hemoglobin: 14.6 g/dL (ref 13.0–17.0)
Lymphocytes Relative: 29.2 % (ref 12.0–46.0)
Lymphs Abs: 1.8 10*3/uL (ref 0.7–4.0)
MCHC: 34.8 g/dL (ref 30.0–36.0)
MCV: 86 fl (ref 78.0–100.0)
Monocytes Absolute: 0.5 10*3/uL (ref 0.1–1.0)
Monocytes Relative: 8.2 % (ref 3.0–12.0)
Neutro Abs: 3.5 10*3/uL (ref 1.4–7.7)
Neutrophils Relative %: 58 % (ref 43.0–77.0)
Platelets: 186 10*3/uL (ref 150.0–400.0)
RBC: 4.87 Mil/uL (ref 4.22–5.81)
RDW: 13.5 % (ref 11.5–15.5)
WBC: 6.1 10*3/uL (ref 4.0–10.5)

## 2020-07-14 LAB — COMPREHENSIVE METABOLIC PANEL
ALT: 50 U/L (ref 0–53)
AST: 33 U/L (ref 0–37)
Albumin: 4.5 g/dL (ref 3.5–5.2)
Alkaline Phosphatase: 75 U/L (ref 39–117)
BUN: 9 mg/dL (ref 6–23)
CO2: 29 mEq/L (ref 19–32)
Calcium: 9 mg/dL (ref 8.4–10.5)
Chloride: 104 mEq/L (ref 96–112)
Creatinine, Ser: 1.08 mg/dL (ref 0.40–1.50)
GFR: 81.4 mL/min (ref >60.00)
Glucose, Bld: 102 mg/dL — ABNORMAL HIGH (ref 70–99)
Potassium: 3.9 mEq/L (ref 3.5–5.1)
Sodium: 140 mEq/L (ref 135–145)
Total Bilirubin: 0.6 mg/dL (ref 0.2–1.2)
Total Protein: 6.8 g/dL (ref 6.0–8.3)

## 2020-07-14 LAB — TSH: TSH: 1.67 u[IU]/mL (ref 0.35–4.50)

## 2020-07-14 LAB — VITAMIN B12: Vitamin B-12: 242 pg/mL (ref 211–911)

## 2020-07-14 MED ORDER — CYCLOBENZAPRINE HCL 5 MG PO TABS: ORAL_TABLET | ORAL | 0 refills | Status: DC

## 2020-07-14 NOTE — Progress Notes (Addendum)
Subjective:    Patient ID: Caleb Clayton, male    DOB: 07-30-1970, 50 y.o.   MRN: [id number]  HPI  Pt in for evaluation.  Pt has history of intermittent on and off HA. Pt states getting headaches every day for past 6 months. Recently when he gets ha will last 3-4 hours. Random daily headache. No particulary time. No dizziness or nausea. Frontal ha and some trapezius pain. Reports moving to new house. Built house but he states was not that stressful.  Pt states in past ha very infrequent about once every 6 months. Self limited type headache. Would last about one hour and respond to excedrin or tylenol. No light or sound sensitivity in past. Sometimes would not trapezius area pain.   Pt just went to optometrist and vision. Pt getting new prescription for glasses by next Monday.   Pt bp is elevated today. His parents had high blood pressure. Pt has not been checking his blood pressure recently.      Review of Systems  Constitutional: Positive for fatigue. Negative for chills and fever.  HENT: Negative for congestion.   Respiratory: Negative for cough, chest tightness, shortness of breath and wheezing.   Cardiovascular: Negative for chest pain and palpitations.  Gastrointestinal: Negative for abdominal distention, blood in stool, constipation, diarrhea, nausea and vomiting.  Musculoskeletal: Negative for back pain.  Skin: Negative for rash.  Neurological: Negative for dizziness, seizures, weakness, light-headedness and headaches.       See hpi. Low level 2/10 ha started about and hour. Usually ha is about 5/10.   Hematological: Negative for adenopathy. Does not bruise/bleed easily.  Psychiatric/Behavioral: Negative for behavioral problems, confusion and dysphoric mood. The patient is not nervous/anxious.      Past Medical History:  Diagnosis Date  . Bipolar disorder ([place])   . Depression   . Heart murmur   . History of skin cancer   . Stutter      Social History    Socioeconomic History  . Marital status: Married    Spouse name: Not on file  . Number of children: Not on file  . Years of education: Not on file  . Highest education level: Not on file  Occupational History  . Not on file  Tobacco Use  . Smoking status: Never Smoker  . Smokeless tobacco: Never Used  Vaping Use  . Vaping Use: Never used  Substance and Sexual Activity  . Alcohol use: No  . Drug use: No  . Sexual activity: Not on file  Other Topics Concern  . Not on file  Social History Narrative   Married. 2 kids 63 and 14 in 2021.       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       Hobbies: yardwork, basketball   Social Determinants of Radio broadcast assistant Strain: Not on file  Food Insecurity: Not on file  Transportation Needs: Not on file  Physical Activity: Not on file  Stress: Not on file  Social Connections: Not on file  Intimate Partner Violence: Not on file    Past Surgical History:  Procedure Laterality Date  . arm fx    . HERNIA REPAIR     46 mo old  . mole bx    . WRIST FRACTURE SURGERY  1985, 1989    Family History  Problem Relation Age of Onset  . Hyperlipidemia Mother  father  . Hypertension Mother        father  . Colon polyps Mother   . Stomach cancer Mother   . Depression Father   . Melanoma Father   . Lung cancer Father   . Colon polyps Father   . Bipolar disorder Neg Hx   . Colon cancer Neg Hx   . Esophageal cancer Neg Hx   . Rectal cancer Neg Hx     No Known Allergies  Current Outpatient Medications on File Prior to Visit  Medication Sig Dispense Refill  . benztropine (COGENTIN) 0.5 MG tablet Take 0.5 mg by mouth 2 (two) times daily.    Marland Kitchen lamoTRIgine (LAMICTAL) 25 MG tablet Take 25 mg by mouth 2 (two) times daily.    . sertraline (ZOLOFT) 100 MG tablet Take 100 mg by mouth daily.    . Sodium Sulfate-Mag Sulfate-KCl (SUTAB) [id number] MG TABS  Take 1 kit by mouth as directed. 24 tablet 0  . triamcinolone cream (KENALOG) 0.1 % Apply topically.     No current facility-administered medications on file prior to visit.    BP (!) 142/89   Pulse 66   Temp 97.9 F (36.6 C)   Resp 18   Ht 6' (1.829 m)   Wt 247 lb (112 kg)   SpO2 98%   BMI 33.50 kg/m       Objective:   Physical Exam  General Mental Status- Alert. General Appearance- Not in acute distress.   Skin General: Color- Normal Color. Moisture- Normal Moisture.  Neck Trapezius tenderness to palpation. No neck stiffness.  Chest and Lung Exam Auscultation: Breath Sounds:-Normal.  Cardiovascular Auscultation:Rythm- Regular. Murmurs & Other Heart Sounds:Auscultation of the heart reveals- No Murmurs.  Abdomen Inspection:-Inspeection Normal. Palpation/Percussion:Note:No mass. Palpation and Percussion of the abdomen reveal- Non Tender, Non Distended + BS, no rebound or guarding.    Neurologic Cranial Nerve exam:- CN III-XII intact(No nystagmus), symmetric smile. Drift Test:- No drift. Romberg Exam:- Negative.  Heal to Toe Gait exam:-Normal. Finger to Nose:- Normal/Intact Strength:- 5/5 equal and symmetric strength both upper and lower extremities.         Assessment & Plan:  History of headaches rare and infrequent in the past.  Over the past 6 months headache is now daily intermittent with increased intensity.  Due to change in features decided to go ahead and put CT of head without contrast order in.  Test does can be routine/not stat.  Minimal low level headache presently 2 out of 10 at best.  Normal neurologic exam.  I recommend using Tylenol today and see if headache resolves.  If headache features change dramatically as discussed then recommend ED evaluation for stat CT of head.  Recent vision check with mild decrease/need for new prescription.  Unclear if this was playing a role in her headaches.  We will get new prescription early June.  Mild  elevated blood pressure today.  No history of hypertension.  Recommend check blood pressure daily when relaxed.  If blood pressure is elevated then might be factor in headaches.  Notify us if blood pressure readings over 140/90.  If blood pressures are 130/80 or less with headache can use ibuprofen or Excedrin.   Recent fatigue so we will get CBC, CMP, B12, B1, TSH and vitamin D level.  Follow-up in 2 weeks with a PCP or as needed here if PCP not available.

## 2020-07-14 NOTE — Patient Instructions (Addendum)
History of headaches rare and infrequent in the past.  Over the past 6 months headache is now daily intermittent with increased intensity.  Due to change in features decided to go ahead and put CT of head without contrast order in.  Test can be routine/not stat.  Minimal low level headache presently 2 out of 10 at best.  Normal neurologic exam.  I recommend using Tylenol today and see if headache resolves.  If headache features change dramatically as discussed then recommend ED evaluation for stat CT of head.  Note also some mild trapezius tenderness to palpation presently.  Tension headache is in differential.  If you get late evening headache with trapezius tightness/tenderness then use low-dose Flexeril.  Sedation is common side effect.  Recent vision check with mild decrease/need for new prescription.  Unclear if this was playing a role in her headaches.  We will get new prescription early June.  Mild elevated blood pressure today.  No history of hypertension.  Recommend check blood pressure daily when relaxed.  If blood pressure is elevated then might be factor in headaches.  Notify us if blood pressure readings over 140/90.  If blood pressures are 130/80 or less with headache can use ibuprofen or Excedrin.   Recent fatigue so we will get CBC, CMP, B12, B1, TSH and vitamin D level.  Follow-up in 2 weeks with a PCP or as needed here if PCP not available.

## 2020-07-17 LAB — VITAMIN D 1,25 DIHYDROXY
Vitamin D 1, 25 (OH)2 Total: 52 pg/mL (ref 18–72)
Vitamin D2 1, 25 (OH)2: <8 pg/mL
Vitamin D3 1, 25 (OH)2: 52 pg/mL

## 2020-07-18 LAB — VITAMIN B1: Vitamin B1 (Thiamine): 12 nmol/L (ref 8–30)

## 2020-07-21 ENCOUNTER — Other Ambulatory Visit: Payer: Self-pay

## 2020-07-21 ENCOUNTER — Ambulatory Visit (HOSPITAL_BASED_OUTPATIENT_CLINIC_OR_DEPARTMENT_OTHER)
Admission: RE | Admit: 2020-07-21 | Discharge: 2020-07-21 | Disposition: A | Discharge status: Home / Self Care | Payer: 59 | Source: Ambulatory Visit | Attending: Medical | Admitting: Medical

## 2020-07-21 DIAGNOSIS — R519 Headache, unspecified: Secondary | ICD-10-CM | POA: Diagnosis present

## 2020-07-30 ENCOUNTER — Other Ambulatory Visit: Payer: Self-pay

## 2020-07-30 ENCOUNTER — Encounter: Payer: Self-pay | Admitting: Family Medicine

## 2020-07-30 ENCOUNTER — Ambulatory Visit: Payer: 59 | Admitting: Family Medicine

## 2020-07-30 VITALS — BP 146/94 | HR 91 | Temp 98.7°F | Ht 72.0 in | Wt 249.4 lb

## 2020-07-30 DIAGNOSIS — F3175 Bipolar disorder, in partial remission, most recent episode depressed: Secondary | ICD-10-CM | POA: Diagnosis not present

## 2020-07-30 DIAGNOSIS — I10 Essential (primary) hypertension: Secondary | ICD-10-CM

## 2020-07-30 MED ORDER — AMLODIPINE BESYLATE 2.5 MG PO TABS: 2.5000 mg | ORAL_TABLET | Freq: Every day | ORAL | 5 refills | Status: DC

## 2020-07-30 NOTE — Patient Instructions (Addendum)
Health Maintenance Due  Topic Date Due  . COVID-19 Vaccine (3 - Booster for [employer] series) Will call back with date.  08/31/2019   Start amlodipine 2.5 mg before bed-update me in 2 weeks with home blood pressures and frequency of headaches.  Schedule a follow-up for approximately a month  Decrease B12 to either 1000 mcg/day or 5000 mcg every 3 days-I think this will be adequate to keep your numbers up  Recommended follow up: Return in about 1 month (around 08/29/2020) for follow up- or sooner if needed.  If you have new or worsening symptoms certainly let us know prior to next visit

## 2020-07-30 NOTE — Progress Notes (Signed)
Phone [phone] In person visit   Subjective:   Caleb Clayton is a 50 y.o. year old very pleasant male patient who presents for/with See problem oriented charting Chief Complaint  Patient presents with  . Headache  . Blood Pressure    Patient states his BP has been running high.   . Vitamin B12 Deficiency    Patient states that he had labs drawn and his B12 is kind of low and he wants to discuss that    This visit occurred during the SARS-CoV-2 public health emergency.  Safety protocols were in place, including screening questions prior to the visit, additional usage of staff PPE, and extensive cleaning of exam room while observing appropriate contact time as indicated for disinfecting solutions.   Past Medical History-  Patient Active Problem List   Diagnosis Date Noted  . Bipolar disorder ([place]) 01/13/2014    Priority: High  . STUTTERING 08/24/2009    Priority: High  . Hyperlipidemia, unspecified 06/18/2019    Priority: Medium  . Snoring 05/08/2007    Priority: Low  . SKIN CANCER, HX OF 11/20/2006    Priority: Low    Medications- reviewed and updated Current Outpatient Medications  Medication Sig Dispense Refill  . amLODipine (NORVASC) 2.5 MG tablet Take 1 tablet (2.5 mg total) by mouth daily. 30 tablet 5  . benztropine (COGENTIN) 0.5 MG tablet Take 0.5 mg by mouth 2 (two) times daily.    Marland Kitchen lamoTRIgine (LAMICTAL) 25 MG tablet Take 25 mg by mouth 2 (two) times daily.    . sertraline (ZOLOFT) 100 MG tablet Take 100 mg by mouth daily.    . cyclobenzaprine (FLEXERIL) 5 MG tablet 1 tab po q hs prn trapezius pain with ha. (Patient not taking: Reported on 07/30/2020) 5 tablet 0  . Sodium Sulfate-Mag Sulfate-KCl (SUTAB) [id number] MG TABS Take 1 kit by mouth as directed. (Patient not taking: Reported on 07/30/2020) 24 tablet 0  . triamcinolone cream (KENALOG) 0.1 % Apply topically. (Patient not taking: Reported on 07/30/2020)     No current facility-administered medications for  this visit.     Objective:  BP (!) 146/94   Pulse 91   Temp 98.7 F (37.1 C) (Temporal)   Ht 6' (1.829 m)   Wt 249 lb 6.4 oz (113.1 kg)   SpO2 98%   BMI 33.82 kg/m  Gen: NAD, resting comfortably CV: RRR no murmurs rubs or gallops Lungs: CTAB no crackles, wheeze, rhonchi Abdomen: soft/nontender/nondistended/normal bowel sounds. No rebound or guarding.  Ext: minimal to trace edema    Assessment and Plan  #Social Updates- sold his current home and is purchasing a new home.   # Bipolar disorder S:Compliant with Latuda 60 mg, Cogentin 0.5 mg twice daily, sertraline 100 mg, Lamictal 25 mg twice a day through psychiatry. A/P: Patient continues to follow with psychiatry- appears to be stable-continue current medication and psychiatry follow-up  #hypertension #Headaches S: Started checking BP 2-3 weeks ago.  Patient not on medication.medications: Tylenol 4x per week- He has been having headaches 2-3 months. No worsening patterns noted. Home readings #s: average  Systolic 175-102 and  average diastolic100 BP Readings from Last 3 Encounters:  07/30/20 (!) 146/94  07/14/20 138/80  07/30/19 131/80  A/P: Patient with new diagnosis of hypertension today with multiple home reading elevations in addition to elevated reading in office confirming diagnosis.  It is possible that elevated blood pressure has contributed to headaches.Start amlodipine 2.5 mg before bed-update me in 2 weeks with home blood  pressures and frequency of headaches.  Schedule a follow-up for approximately a month -With Tylenol 4 times a week could be causing rebound headaches as well-we will need to emphasize at next visit cutting down dose  # B12 deficiency S: Current treatment/medication (oral vs. IM): 5000 units Lab Results  Component Value Date   VITAMINB12 242 07/14/2020  A/P: Suspect improving control but I also do not think patient needs this higher dose.  Decrease B-12 to 1000 mg per day or 5000 mg every 3 days -  I think this will be adequate to keep your numbers up.  Recommended follow GL:OVFIEP in about 1 month (around 08/29/2020) for follow up- or sooner if needed. Future Appointments  Date Time Provider Lead  09/04/2020  3:40 PM Marin Olp, MD [facility]  11/03/2020 11:20 AM Yong Channel Brayton Mars, MD [facility]    Lab/Order associations:   ICD-10-CM   1. Essential hypertension  I10   2. Bipolar disorder, in partial remission, most recent episode depressed ([place]) Chronic F31.75     Meds ordered this encounter  Medications  . amLODipine (NORVASC) 2.5 MG tablet    Sig: Take 1 tablet (2.5 mg total) by mouth daily.    Dispense:  30 tablet    Refill:  5   I,Harris Phan,acting as a scribe for Garret Reddish, MD.,have documented all relevant documentation on the behalf of Garret Reddish, MD,as directed by  Garret Reddish, MD while in the presence of Garret Reddish, MD.   I, Garret Reddish, MD, have reviewed all documentation for this visit. The documentation on 08/01/20 for the exam, diagnosis, procedures, and orders are all accurate and complete.   Return precautions advised.  Garret Reddish, MD

## 2020-07-31 ENCOUNTER — Encounter: Payer: Self-pay | Admitting: Family Medicine

## 2020-08-18 ENCOUNTER — Other Ambulatory Visit: Payer: Self-pay

## 2020-08-18 ENCOUNTER — Encounter: Payer: Self-pay | Admitting: Family Medicine

## 2020-08-18 MED ORDER — AMLODIPINE BESYLATE 5 MG PO TABS: 5.0000 mg | ORAL_TABLET | Freq: Every day | ORAL | 3 refills | Status: DC

## 2020-09-04 ENCOUNTER — Ambulatory Visit: Payer: 59 | Admitting: Family Medicine

## 2020-09-04 ENCOUNTER — Other Ambulatory Visit: Payer: Self-pay

## 2020-09-04 ENCOUNTER — Encounter: Payer: Self-pay | Admitting: Family Medicine

## 2020-09-04 VITALS — BP 128/88 | HR 78 | Temp 98.6°F | Ht 72.0 in | Wt 246.0 lb

## 2020-09-04 DIAGNOSIS — I10 Essential (primary) hypertension: Secondary | ICD-10-CM | POA: Diagnosis not present

## 2020-09-04 NOTE — Progress Notes (Signed)
  Phone [phone] In person visit   Subjective:   Caleb Clayton is a 50 y.o. year old very pleasant male patient who presents for/with See problem oriented charting Chief Complaint  Patient presents with   Hypertension   This visit occurred during the SARS-CoV-2 public health emergency.  Safety protocols were in place, including screening questions prior to the visit, additional usage of staff PPE, and extensive cleaning of exam room while observing appropriate contact time as indicated for disinfecting solutions.   Past Medical History-  Patient Active Problem List   Diagnosis Date Noted   Bipolar disorder ([place]) 01/13/2014    Priority: High   STUTTERING 08/24/2009    Priority: High   Essential hypertension 09/04/2020    Priority: Medium   Hyperlipidemia, unspecified 06/18/2019    Priority: Medium   Snoring 05/08/2007    Priority: Low   SKIN CANCER, HX OF 11/20/2006    Priority: Low    Medications- reviewed and updated Current Outpatient Medications  Medication Sig Dispense Refill   amLODipine (NORVASC) 5 MG tablet Take 1 tablet (5 mg total) by mouth daily. 90 tablet 3   benztropine (COGENTIN) 0.5 MG tablet Take 0.5 mg by mouth 2 (two) times daily.     clonazePAM (KLONOPIN) 1 MG tablet Take 1 mg by mouth at bedtime as needed.     lamoTRIgine (LAMICTAL) 25 MG tablet Take 25 mg by mouth 2 (two) times daily.     sertraline (ZOLOFT) 100 MG tablet Take 100 mg by mouth daily.     No current facility-administered medications for this visit.     Objective:  BP 128/88   Pulse 78   Temp 98.6 F (37 C) (Temporal)   Ht 6' (1.829 m)   Wt 246 lb (111.6 kg)   SpO2 97%   BMI 33.36 kg/m  Gen: NAD, resting comfortably CV: RRR no murmurs rubs or gallops Lungs: CTAB no crackles, wheeze, rhonchi Ext: minimal edema Skin: warm, dry    Assessment and Plan   #hypertension S: medication:  amlodipine 5 mg Home readings #s: low 140s/around 100. Last visit up to 150s.  -was  having frequent headaches and with meds on report 6/21 and now reports not having headaches at all at this point -also down 3 lbs- monitoring food intake/meals - congratulated on efforts. Having trouble kicking sodas- working on reduction instead of elimination BP Readings from Last 3 Encounters:  09/04/20 128/88  07/30/20 (!) 146/94  07/14/20 138/80  A/P: from avs "Thrilled you are not having headaches anymore!  Blood pressure is reasonably controlled in the office at 128/88-we opted to continue current medicine.  You have had some slight elevations at home and you agreed to bring your home cuff to next visit so we can compare your cuff to our readings.  It is possible your home cuff runs slightly higher than our readings.  If numbers do remain high at follow-up we would have options of increasing amlodipine to 10 mg (which could cause some swelling in the legs potentially) or adding a diuretic like hydrochlorothiazide or finally changing to a combination pill like amlodipine 5 mg with an angiotensin receptor blocker included."  Recommended follow up: keep sept visit Future Appointments  Date Time Provider [place]  11/03/2020 11:20 AM Marin Olp, MD [facility]   Lab/Order associations:   ICD-10-CM   1. Essential hypertension  I10       Return precautions advised.  Garret Reddish, MD

## 2020-09-04 NOTE — Patient Instructions (Addendum)
Thrilled you are not having headaches anymore!  Blood pressure is reasonably controlled in the office at 128/88-we opted to continue current medicine.  You have had some slight elevations at home and you agreed to bring your home cuff to next visit so we can compare your cuff to our readings.  It is possible your home cuff runs slightly higher than our readings.  If numbers do remain high at follow-up we would have options of increasing amlodipine to 10 mg (which could cause some swelling in the legs potentially) or adding a diuretic like hydrochlorothiazide or finally changing to a combination pill like amlodipine 5 mg with an angiotensin receptor blocker included.  Recommended follow up: keep September visit

## 2020-09-07 ENCOUNTER — Other Ambulatory Visit: Payer: 59

## 2020-09-21 ENCOUNTER — Encounter: Payer: Self-pay | Admitting: Medical

## 2020-09-30 ENCOUNTER — Encounter: Payer: Self-pay | Admitting: Medical

## 2020-10-06 NOTE — Telephone Encounter (Signed)
Spoke with Altha Harm at [facility], gave E53.8 (vit b12 def) dx to resubmit claim for B12 on 07/14/20. May take up to 30 days to process and received a determination.

## 2020-10-23 ENCOUNTER — Ambulatory Visit: Payer: 59 | Admitting: Family Medicine

## 2020-10-23 ENCOUNTER — Other Ambulatory Visit: Payer: Self-pay

## 2020-10-23 ENCOUNTER — Encounter: Payer: Self-pay | Admitting: Family Medicine

## 2020-10-23 VITALS — BP 136/92 | HR 77 | Temp 98.0°F | Wt 236.6 lb

## 2020-10-23 DIAGNOSIS — M5442 Lumbago with sciatica, left side: Secondary | ICD-10-CM

## 2020-10-23 DIAGNOSIS — M5441 Lumbago with sciatica, right side: Secondary | ICD-10-CM

## 2020-10-23 MED ORDER — CYCLOBENZAPRINE HCL 5 MG PO TABS: 5.0000 mg | ORAL_TABLET | Freq: Every evening | ORAL | 0 refills | Status: DC | PRN

## 2020-10-23 NOTE — Patient Instructions (Signed)
You can use heat, ice, stretching,.  You can also do the topical rubs like biofreeze, icy hot, or Aspercreme.

## 2020-10-23 NOTE — Progress Notes (Signed)
Subjective:    Patient ID: Caleb Clayton, male    DOB: 09-29-1970, 50 y.o.   MRN: [id number]  Chief Complaint  Patient presents with   Back Pain    Playes tennis on Tuesday, started feeling pain in lower rt side of back and rt leg tingles. Legs feel wobbly, last night woke up around 2 am and wa unable to go back to sleep 2/2 pain, needing to walk around.has not taking anything for the pain    HPI Patient was seen today for acute concern.  Pt developed low back pain with numbness and tingling in posterior b/l LEs after playing a casual game tennis on Tued[place] 8/23.  Pt endorses muscle spasms in low back, legs feeling weak when walking.  Having difficulty getting comfortable at night. Denies loss of bowel or bladder, fever, chills.  Patient has not taken anything for his symptoms.  Past Medical History:  Diagnosis Date   Bipolar disorder ([place])    Depression    Heart murmur    History of skin cancer    Stutter     No Known Allergies  ROS General: Denies fever, chills, night sweats, changes in weight, changes in appetite HEENT: Denies headaches, ear pain, changes in vision, rhinorrhea, sore throat CV: Denies CP, palpitations, SOB, orthopnea Pulm: Denies SOB, cough, wheezing GI: Denies abdominal pain, nausea, vomiting, diarrhea, constipation GU: Denies dysuria, hematuria, frequency Msk: Denies muscle cramps, joint pains + low back pain numbness and tingling in LEs. Neuro: Denies weakness, numbness, tingling Skin: Denies rashes, bruising Psych: Denies depression, anxiety, hallucinations      Objective:    Blood pressure (!) 136/92, pulse 77, temperature 98 F (36.7 C), temperature source Oral, weight 236 lb 9.6 oz (107.3 kg), SpO2 97 %.   Gen. Pleasant, well-nourished, in no distress, normal affect   HEENT: [place]/AT, face symmetric, conjunctiva clear, no scleral icterus, PERRLA, EOMI, nares patent without drainage Lungs: no accessory muscle use, CTAB, no wheezes or  rales Cardiovascular: RRR, no m/r/g, no peripheral edema Musculoskeletal: No TTP of cervical, thoracic spine.  TTP of midline lumbar spine, bilateral lumbar paraspinal muscles, and bilateral sciatic nerves.  No weakness in LEs.  No deformities, no cyanosis or clubbing, normal tone Neuro:  A&Ox3, CN II-XII intact, normal gait Skin:  Warm, no lesions/ rash   Wt Readings from Last 3 Encounters:  10/23/20 236 lb 9.6 oz (107.3 kg)  09/04/20 246 lb (111.6 kg)  07/30/20 249 lb 6.4 oz (113.1 kg)    Lab Results  Component Value Date   WBC 6.1 07/14/2020   HGB 14.6 07/14/2020   HCT 41.9 07/14/2020   PLT 186.0 07/14/2020   GLUCOSE 102 (H) 07/14/2020   CHOL 190 06/18/2019   TRIG 118.0 06/18/2019   HDL 34.20 (L) 06/18/2019   LDLCALC 132 (H) 06/18/2019   ALT 50 07/14/2020   AST 33 07/14/2020   NA 140 07/14/2020   K 3.9 07/14/2020   CL 104 07/14/2020   CREATININE 1.08 07/14/2020   BUN 9 07/14/2020   CO2 29 07/14/2020   TSH 1.67 07/14/2020    Assessment/Plan:  Acute midline low back pain with bilateral sciatica -Likely 2/2 muscle strain.  Discussed likely duration of symptoms -Discussed supportive care including heat, ice, stretching, topical analgesics, Tylenol. -Discussed muscle relaxer.  Advised may cause drowsiness.  Can try taking half a tab to see how the medication makes him feel. -Reviewed stretching exercises -Given handout -Given precautions - Plan: cyclobenzaprine (FLEXERIL) 5 MG tablet  F/u as needed  Grier Mitts, MD

## 2020-11-03 ENCOUNTER — Encounter: Payer: Self-pay | Admitting: Family Medicine

## 2020-11-03 ENCOUNTER — Ambulatory Visit (INDEPENDENT_AMBULATORY_CARE_PROVIDER_SITE_OTHER): Payer: 59 | Admitting: Family Medicine

## 2020-11-03 ENCOUNTER — Other Ambulatory Visit: Payer: Self-pay

## 2020-11-03 VITALS — BP 128/74 | HR 65 | Temp 98.1°F | Resp 17 | Ht 72.0 in | Wt 223.0 lb

## 2020-11-03 DIAGNOSIS — Z0000 Encounter for general adult medical examination without abnormal findings: Secondary | ICD-10-CM

## 2020-11-03 DIAGNOSIS — E785 Hyperlipidemia, unspecified: Secondary | ICD-10-CM | POA: Diagnosis not present

## 2020-11-03 DIAGNOSIS — F3175 Bipolar disorder, in partial remission, most recent episode depressed: Secondary | ICD-10-CM | POA: Diagnosis not present

## 2020-11-03 DIAGNOSIS — I10 Essential (primary) hypertension: Secondary | ICD-10-CM

## 2020-11-03 LAB — CBC WITH DIFFERENTIAL/PLATELET
Basophils Absolute: 0.1 10*3/uL (ref 0.0–0.1)
Basophils Relative: 1.7 % (ref 0.0–3.0)
Eosinophils Absolute: 0.1 10*3/uL (ref 0.0–0.7)
Eosinophils Relative: 2.8 % (ref 0.0–5.0)
HCT: 42.7 % (ref 39.0–52.0)
Hemoglobin: 14.6 g/dL (ref 13.0–17.0)
Lymphocytes Relative: 34.9 % (ref 12.0–46.0)
Lymphs Abs: 1.6 10*3/uL (ref 0.7–4.0)
MCHC: 34.1 g/dL (ref 30.0–36.0)
MCV: 86.8 fl (ref 78.0–100.0)
Monocytes Absolute: 0.4 10*3/uL (ref 0.1–1.0)
Monocytes Relative: 7.9 % (ref 3.0–12.0)
Neutro Abs: 2.4 10*3/uL (ref 1.4–7.7)
Neutrophils Relative %: 52.7 % (ref 43.0–77.0)
Platelets: 182 10*3/uL (ref 150.0–400.0)
RBC: 4.92 Mil/uL (ref 4.22–5.81)
RDW: 13.2 % (ref 11.5–15.5)
WBC: 4.5 10*3/uL (ref 4.0–10.5)

## 2020-11-03 LAB — COMPREHENSIVE METABOLIC PANEL
ALT: 35 U/L (ref 0–53)
AST: 29 U/L (ref 0–37)
Albumin: 4.8 g/dL (ref 3.5–5.2)
Alkaline Phosphatase: 75 U/L (ref 39–117)
BUN: 14 mg/dL (ref 6–23)
CO2: 27 mEq/L (ref 19–32)
Calcium: 9.6 mg/dL (ref 8.4–10.5)
Chloride: 102 mEq/L (ref 96–112)
Creatinine, Ser: 1.03 mg/dL (ref 0.40–1.50)
GFR: 85.98 mL/min (ref >60.00)
Glucose, Bld: 85 mg/dL (ref 70–99)
Potassium: 4.1 mEq/L (ref 3.5–5.1)
Sodium: 138 mEq/L (ref 135–145)
Total Bilirubin: 0.6 mg/dL (ref 0.2–1.2)
Total Protein: 7.6 g/dL (ref 6.0–8.3)

## 2020-11-03 LAB — LIPID PANEL
Cholesterol: 184 mg/dL (ref 0–200)
HDL: 32.4 mg/dL — ABNORMAL LOW (ref >39.00)
LDL Cholesterol: 136 mg/dL — ABNORMAL HIGH (ref 0–99)
NonHDL: 151.27
Total CHOL/HDL Ratio: 6
Triglycerides: 76 mg/dL (ref 0.0–149.0)
VLDL: 15.2 mg/dL (ref 0.0–40.0)

## 2020-11-03 NOTE — Patient Instructions (Addendum)
Please stop by lab before you go If you have mychart- we will send your results within 3 business days of [place] receiving them.  If you do not have mychart- we will call you about results within 5 business days of [place] receiving them.  *please also note that you will see labs on mychart as soon as they post. I will later go in and write notes on them- will say "notes from Dr. Yong Channel"  The bump on the right lower quadrant of your abdomen looks like it could be a lipoma. Please continue to monitor this area if it starts grow in size or gets painful for you let us know. Update with me if you notice any changes.  I am glad to see that you found [place] weight loss- keep up the great job!   Recommended follow up: Return in about 6 months (around 05/03/2021) for follow up- or sooner if needed.

## 2020-11-03 NOTE — Progress Notes (Signed)
Phone: [phone]    Subjective:  Patient presents today for their annual physical. Chief complaint-noted.   See problem oriented charting- Review of Systems  Constitutional:  Positive for weight loss (intentional). Negative for chills and fever.  HENT:  Negative for congestion, hearing loss, nosebleeds and tinnitus.   Eyes:  Negative for blurred vision and double vision.  Respiratory:  Negative for cough and shortness of breath.   Cardiovascular:  Negative for chest pain and palpitations.  Gastrointestinal:  Negative for abdominal pain, constipation, diarrhea, heartburn, nausea and vomiting.  Genitourinary:  Negative for dysuria and frequency.  Musculoskeletal:  Negative for back pain, myalgias and neck pain.  Skin:  Negative for itching and rash.  Neurological:  Negative for dizziness and headaches.  Endo/Heme/Allergies:  Negative for polydipsia. Does not bruise/bleed easily.  Psychiatric/Behavioral:  Negative for depression and suicidal ideas.    The following were reviewed and entered/updated in epic: Past Medical History:  Diagnosis Date   Bipolar disorder ([place])    Depression    Heart murmur    History of skin cancer    Stutter    Patient Active Problem List   Diagnosis Date Noted   Bipolar disorder ([place]) 01/13/2014    Priority: High   STUTTERING 08/24/2009    Priority: High   Essential hypertension 09/04/2020    Priority: Medium   Hyperlipidemia, unspecified 06/18/2019    Priority: Medium   Snoring 05/08/2007    Priority: Low   SKIN CANCER, HX OF 11/20/2006    Priority: Low   Past Surgical History:  Procedure Laterality Date   arm fx     HERNIA REPAIR     9 mo old   mole bx     WRIST FRACTURE SURGERY  1985, 1989    Family History  Problem Relation Age of Onset   Hyperlipidemia Mother        father   Hypertension Mother        father   Colon polyps Mother    Stomach cancer Mother    Depression Father    Melanoma Father    Lung cancer Father     Colon polyps Father    Prostate cancer Paternal Grandfather        late in life   Bipolar disorder Neg Hx    Colon cancer Neg Hx    Esophageal cancer Neg Hx    Rectal cancer Neg Hx     Medications- reviewed and updated Current Outpatient Medications  Medication Sig Dispense Refill   amLODipine (NORVASC) 5 MG tablet Take 1 tablet (5 mg total) by mouth daily. 90 tablet 3   benztropine (COGENTIN) 0.5 MG tablet Take 0.5 mg by mouth 2 (two) times daily.     clonazePAM (KLONOPIN) 1 MG tablet Take 1 mg by mouth at bedtime as needed.     cyclobenzaprine (FLEXERIL) 5 MG tablet Take 1 tablet (5 mg total) by mouth at bedtime as needed for muscle spasms. 30 tablet 0   lamoTRIgine (LAMICTAL) 25 MG tablet Take 25 mg by mouth 2 (two) times daily.     sertraline (ZOLOFT) 100 MG tablet Take 100 mg by mouth daily.     No current facility-administered medications for this visit.    Allergies-reviewed and updated No Known Allergies  Social History   Social History Narrative   Married. 2 kids 30 and 14 in 2021.       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       Hobbies: yardwork, basketball      Objective:  BP 128/74   Pulse 65   Temp 98.1 F (36.7 C) (Temporal)   Resp 17   Ht 6' (1.829 m)   Wt 223 lb (101.2 kg)   SpO2 98%   BMI 30.24 kg/m  Gen: NAD, resting comfortably HEENT: Mucous membranes are moist. Oropharynx normal Neck: no thyromegaly CV: RRR no murmurs rubs or gallops Lungs: CTAB no crackles, wheeze, rhonchi Abdomen: soft/nontender/nondistended/normal bowel sounds. No rebound or guarding.  Ext: no edema Skin: warm, dry Neuro: grossly normal, moves all extremities, PERRLA     Assessment and Plan:  50 y.o. male presenting for annual physical.  Health Maintenance counseling: 1. Anticipatory guidance: Patient counseled regarding regular dental exams -q6 months, eye exams -yearly,  avoiding smoking and  second hand smoke , limiting alcohol to 2 beverages per day -does not drink at all.   2. Risk factor reduction:  Advised patient of need for regular exercise and diet rich and fruits and vegetables to reduce risk of heart attack and stroke. Exercise-tennis with daughter 3 days a week- she is on varsity tennis Diet- working with [employer] weight loss and phenomenal progress- congratulated him.   Wt Readings from Last 3 Encounters:  11/03/20 223 lb (101.2 kg)  10/23/20 236 lb 9.6 oz (107.3 kg)  09/04/20 246 lb (111.6 kg)  3. Immunizations/screenings/ancillary studies-plans on flu shot later in the season, hepatitis C screening opts out  Immunization History  Administered Date(s) Administered   Influenza Split 12/05/2019   Influenza-Unspecified 12/07/2013, 12/01/2016, 12/06/2018   PFIZER(Purple Top)SARS-COV-2 Vaccination 03/13/2019, 04/03/2019, 11/29/2019   Td 11/20/2006   Tdap 06/18/2019  4. Prostate cancer screening- no early family history, start at age 30. Paternal grandfather had prostate cancer late in life.   5. Colon cancer screening - 07/30/19 with 3 year repeat planned due to tubular adenomas 6. Skin cancer screening/prevention- Father with history of melanoma - he follows with derm yearly.advised regular sunscreen use. Denies worrisome, changing, or new skin lesions.  7. Testicular cancer screening- advised monthly self exams  8. STD screening- patient opts out as monogamous 9. Never smoker- father passed in September 2020 of lung cancer unfortunately  Status of chronic or acute concerns   # Bipolar disorder S:Compliant with Latuda 60 mg, Cogentin 0.5 mg twice daily, sertraline 100 mg, Lamictal 25 mg twice a day through psychiatry.  -in past- clonazepam at bedtime for sleep A/P: Patient reports reasonably stable/in remission-continue current medications  # Acute midline low back pain with bilateral sciatica S: Patient was seen on 10/23/20 by Dr. Volanda Napoleon, MD, with a chief complaint  for an acute concern - he developed low back pain with numbness and tingling in posterior b/I Les after playing a game of tennis 8/23. Denied loss of bowel or bladder, fever, chills. He gets muscle spasms in low back, legs feeling weak when walking and has difficulty getting comfortable at night.  He had not taken anything for his symptoms. - Discussed supportive care including heat, ice, stretching, topical analgesics, Tylenol. - Discussed muscle relaxer.  Advised may cause drowsiness.  Can try taking half a tab to see how the medication makes him feel. - Patient was prescribed Flexeril 5 mg and instructed to take this at bedtime as needed   A/P: much better thankfully- wants to hold off on PT for now   #hyperlipidemia S: Not currently on medication  Lab Results  Component Value Date   CHOL 190 06/18/2019   HDL 34.20 (L) 06/18/2019   LDLCALC 132 (H) 06/18/2019   TRIG 118.0 06/18/2019   CHOLHDL 6 06/18/2019  A/P: Mild elevations in the past-update lipid panel and calculate ASCVD risk- hoping improved with weight loss  #hypertension S: medication: amlodipine 5 mg daily - patient did have headaches in the past - reported no longer having them on 09/04/20 visit- still no headaches  Home readings #s: good control BP Readings from Last 3 Encounters:  11/03/20 128/74  10/23/20 (!) 136/92  09/04/20 128/88  A/P:  Controlled. Continue current medications.    #Small bump on right lower quadrant-first noticed this 3 weeks ago - about 3 x 3 - may have just noted as lost weight- from July down 23 lbs- with [employer] weight loss- they help with meal planning.  -this appears to be a lipoma to me- he will keep an eye on it- if gets larger or painful or stuck down he will let us know  Recommended follow up: No follow-ups on file.  Lab/Order associations: fasting   ICD-10-CM   1. Preventative health care  Z00.00 CBC with Differential/Platelet    Comprehensive metabolic panel    Lipid panel     2. Bipolar disorder, in partial remission, most recent episode depressed ([place])  F31.75     3. Hyperlipidemia, unspecified hyperlipidemia type  E78.5 CBC with Differential/Platelet    Comprehensive metabolic panel    Lipid panel    4. Essential hypertension  I10 CBC with Differential/Platelet    Comprehensive metabolic panel    Lipid panel     I,Harris Phan,acting as a scribe for Garret Reddish, MD.,have documented all relevant documentation on the behalf of Garret Reddish, MD,as directed by  Garret Reddish, MD while in the presence of Garret Reddish, MD.  I, Garret Reddish, MD, have reviewed all documentation for this visit. The documentation on 11/03/20 for the exam, diagnosis, procedures, and orders are all accurate and complete.  Return precautions advised.   Garret Reddish, MD

## 2020-11-18 ENCOUNTER — Encounter: Payer: Self-pay | Admitting: Medical

## 2020-12-30 NOTE — Telephone Encounter (Signed)
Pt returned my call and was notified of below. He states he has taken care of the [employer]. Form faxed back to [phone] with no new codes to submit for Vit D testing.

## 2020-12-30 NOTE — Telephone Encounter (Signed)
Received paper from [facility] stating they received a request from the patient to re-file a claim for reprocessing from 07/14/20. They are asking that we submit all appropriate information for resubmission.  No test name is included on this paperwork.  Called [phone] and spoke with Magda Paganini, she states claim is for a Vitamin D level. Left message for pt to return my call.

## 2021-01-04 ENCOUNTER — Encounter: Payer: Self-pay | Admitting: Family Medicine

## 2021-01-04 MED ORDER — SCOPOLAMINE 1 MG/3DAYS TD PT72: 1.0000 | MEDICATED_PATCH | TRANSDERMAL | 0 refills | Status: DC

## 2021-04-28 NOTE — Progress Notes (Incomplete)
? ?Phone [phone] ?In person visit ?  ?Subjective:  ? ?Caleb Clayton is a 51 y.o. year old very pleasant male patient who presents for/with See problem oriented charting ?No chief complaint on file. ? ? ?This visit occurred during the SARS-CoV-2 public health emergency.  Safety protocols were in place, including screening questions prior to the visit, additional usage of staff PPE, and extensive cleaning of exam room while observing appropriate contact time as indicated for disinfecting solutions.  ? ?Past Medical History-  ?Patient Active Problem List  ? Diagnosis Date Noted  ? Essential hypertension 09/04/2020  ? Hyperlipidemia, unspecified 06/18/2019  ? Bipolar disorder ([place]) 01/13/2014  ? STUTTERING 08/24/2009  ? Snoring 05/08/2007  ? SKIN CANCER, HX OF 11/20/2006  ? ? ?Medications- reviewed and updated ?Current Outpatient Medications  ?Medication Sig Dispense Refill  ? amLODipine (NORVASC) 5 MG tablet Take 1 tablet (5 mg total) by mouth daily. 90 tablet 3  ? benztropine (COGENTIN) 0.5 MG tablet Take 0.5 mg by mouth 2 (two) times daily.    ? clonazePAM (KLONOPIN) 1 MG tablet Take 1 mg by mouth at bedtime as needed.    ? cyclobenzaprine (FLEXERIL) 5 MG tablet Take 1 tablet (5 mg total) by mouth at bedtime as needed for muscle spasms. 30 tablet 0  ? lamoTRIgine (LAMICTAL) 25 MG tablet Take 25 mg by mouth 2 (two) times daily.    ? scopolamine (TRANSDERM-SCOP) 1 MG/3DAYS Place 1 patch (1.5 mg total) onto the skin every 3 (three) days. 10 patch 0  ? sertraline (ZOLOFT) 100 MG tablet Take 100 mg by mouth daily.    ? ?No current facility-administered medications for this visit.  ? ?  ?Objective:  ?There were no vitals taken for this visit. ?Gen: NAD, resting comfortably ?CV: RRR no murmurs rubs or gallops ?Lungs: CTAB no crackles, wheeze, rhonchi ?Abdomen: soft/nontender/nondistended/normal bowel sounds. No rebound or guarding.  ?Ext: no edema ?Skin: warm, dry ?Neuro: grossly normal, moves all  extremities ? ?*** ?  ? ?Assessment and Plan  ? ?# Bipolar disorder ?S:Compliant with Cogentin 0.5 mg twice daily, sertraline 100 mg daily, Lamictal 25 mg twice a day through psychiatry.  ?-in past- clonazepam at bedtime for sleep and Latuda 60 mg, ?A/P: ***  ? ?#hyperlipidemia ?S: Not currently on medication***  ?Lab Results  ?Component Value Date  ? CHOL 184 11/03/2020  ? HDL 32.40 (L) 11/03/2020  ? LDLCALC 136 (H) 11/03/2020  ? TRIG 76.0 11/03/2020  ? CHOLHDL 6 11/03/2020  ? A/P: *** ? ?#hypertension ?S: medication: amlodipine 5 mg daily ?- patient did have headaches in the past - reported no longer having them on 09/04/20 visit- still no headaches  ?Home readings #s: *** ?BP Readings from Last 3 Encounters:  ?11/03/20 128/74  ?10/23/20 (!) 136/92  ?09/04/20 128/88  ?A/P: *** ? ? ?There are no preventive care reminders to display for this patient. ? ?Recommended follow up: No follow-ups on file. ?Future Appointments  ?Date Time Provider [place]  ?05/04/2021  3:40 PM Marin Olp, MD [facility]  ?11/04/2021  8:20 AM Marin Olp, MD [facility]  ? ? ?Lab/Order associations: ?No diagnosis found. ? ?No orders of the defined types were placed in this encounter. ? ?I,Jada Bradford,acting as a scribe for Garret Reddish, MD.,have documented all relevant documentation on the behalf of Garret Reddish, MD,as directed by  Garret Reddish, MD while in the presence of Garret Reddish, MD. ? ?*** ? ?Return precautions advised.  ?[name] ? ? ?

## 2021-05-04 ENCOUNTER — Ambulatory Visit: Payer: 59 | Admitting: Family Medicine

## 2021-05-04 DIAGNOSIS — E785 Hyperlipidemia, unspecified: Secondary | ICD-10-CM

## 2021-05-04 DIAGNOSIS — F3175 Bipolar disorder, in partial remission, most recent episode depressed: Secondary | ICD-10-CM

## 2021-05-04 DIAGNOSIS — Z0000 Encounter for general adult medical examination without abnormal findings: Secondary | ICD-10-CM

## 2021-05-04 DIAGNOSIS — I10 Essential (primary) hypertension: Secondary | ICD-10-CM

## 2021-06-15 IMAGING — CT CT HEAD W/O CM
3 series · 15 of 47 positions shown, 18 images · non-contrast
Comparison: None.

CLINICAL DATA: Headache

EXAM:
CT HEAD WITHOUT CONTRAST
TECHNIQUE: Contiguous axial images were obtained from the base of the skull
through the vertex without intravenous contrast.

[Series 2: head wo · axial · 0.42mm/px · z∈[-181,-46]mm · 9 of 33 slices shown, 12 images]
[im 3/33  brain]
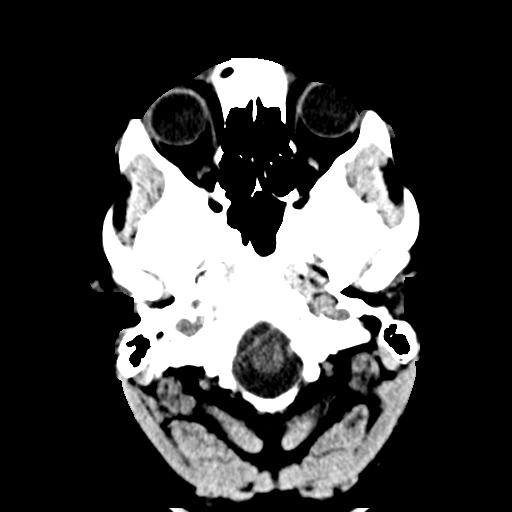
[im 3/33  bone]
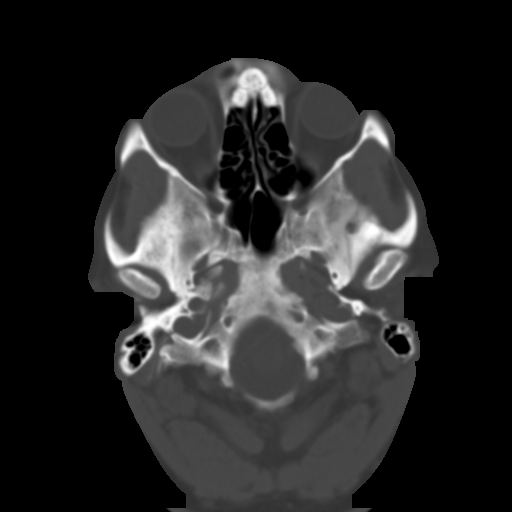
[im 6/33  brain]
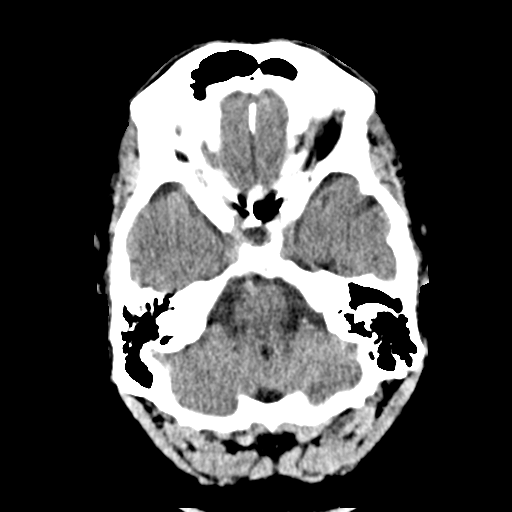
[im 9/33  brain]
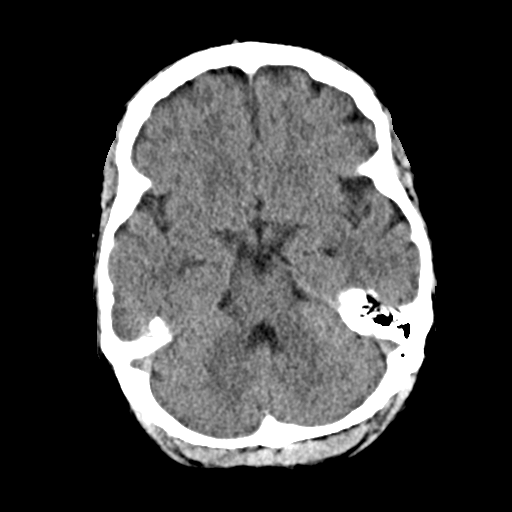
[im 13/33  brain]
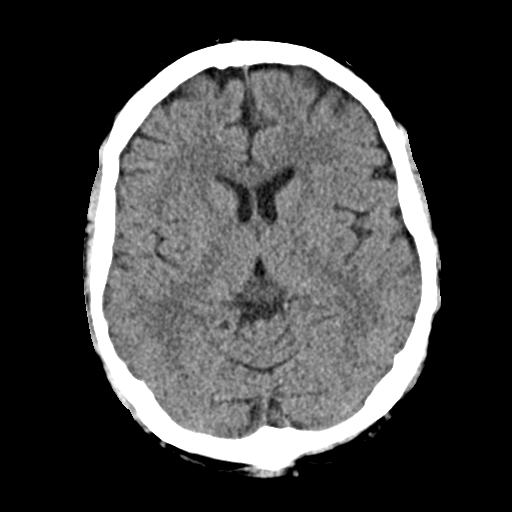
[im 17/33  brain]
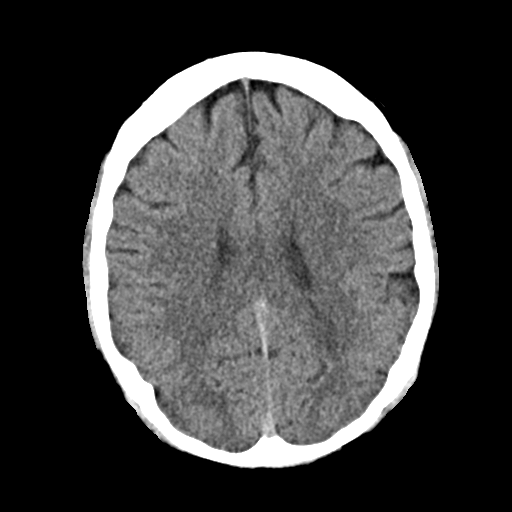
[im 17/33  bone]
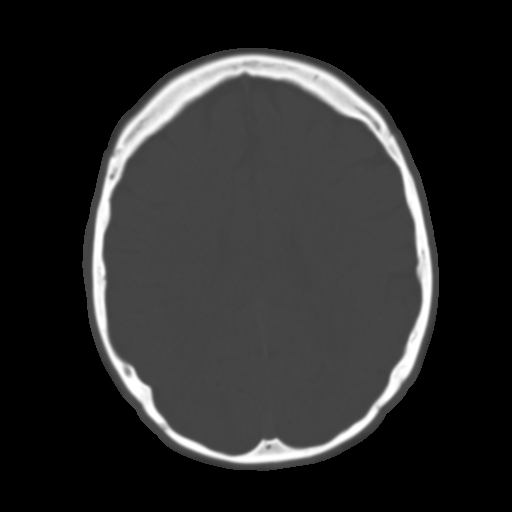
[im 20/33  brain]
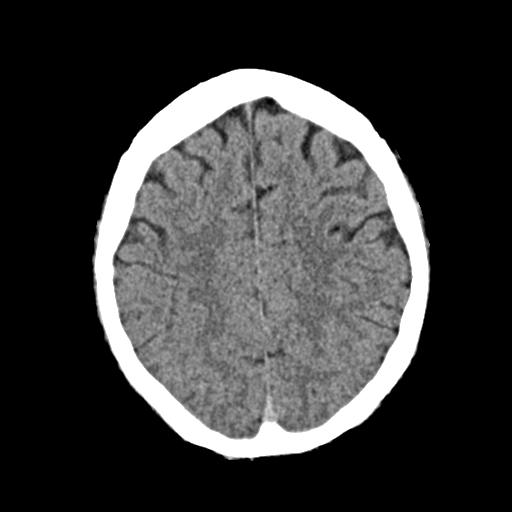
[im 24/33  brain]
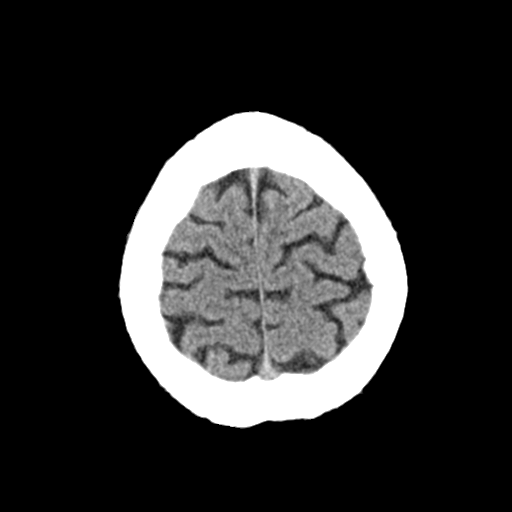
[im 27/33  brain]
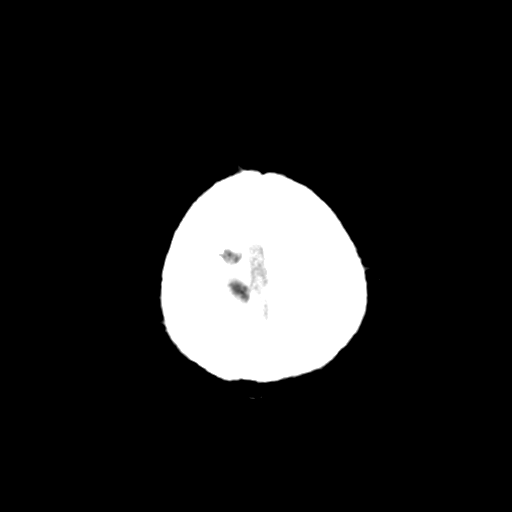
[im 30/33  brain]
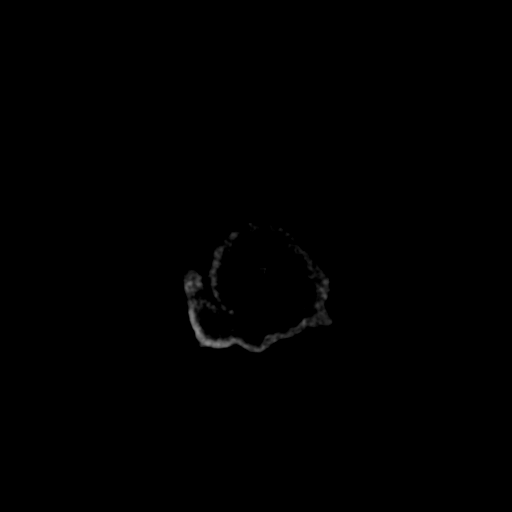
[im 30/33  bone]
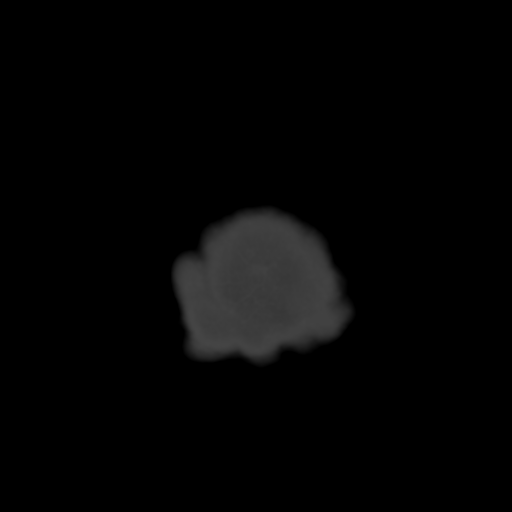

[Series 4: coronal soft · coronal · 0.33mm/px · 3 of 71 slices shown]
[im 24/71  brain]
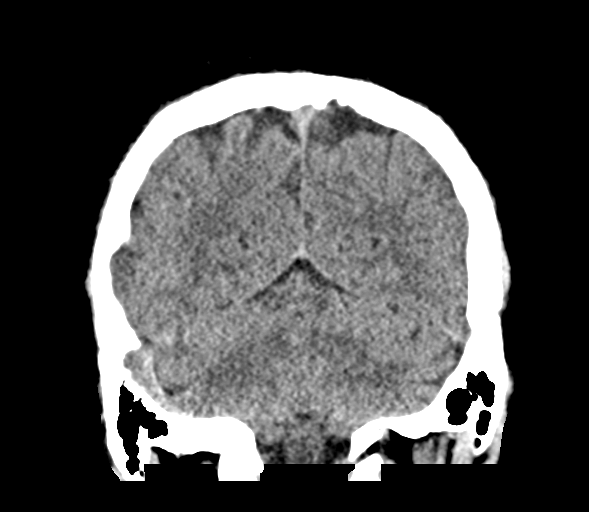
[im 32/71  brain]
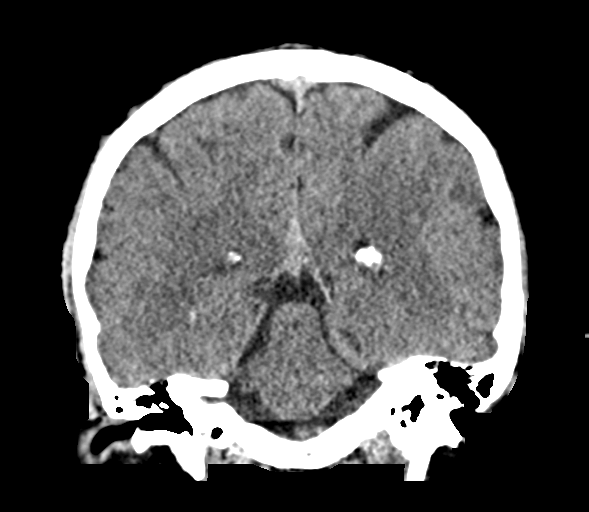
[im 39/71  brain]
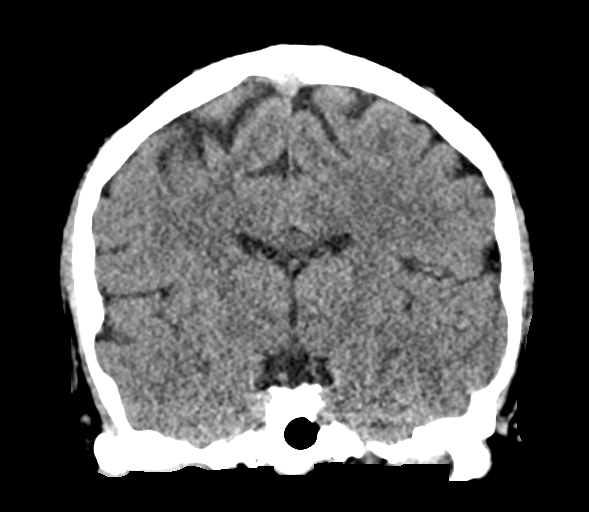

[Series 5: sag soft · sagittal · 0.32mm/px · 3 of 58 slices shown]
[im 20/58  brain]
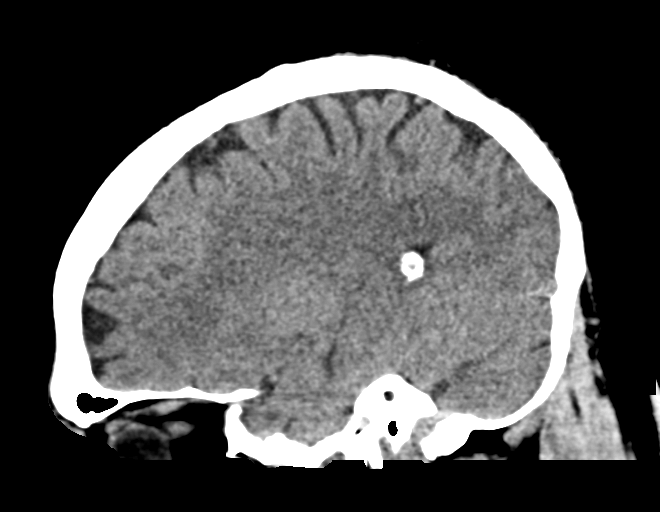
[im 29/58  brain]
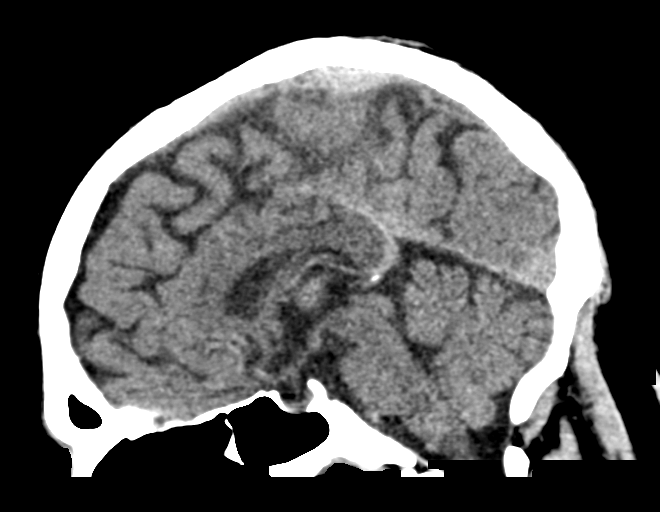
[im 39/58  brain]
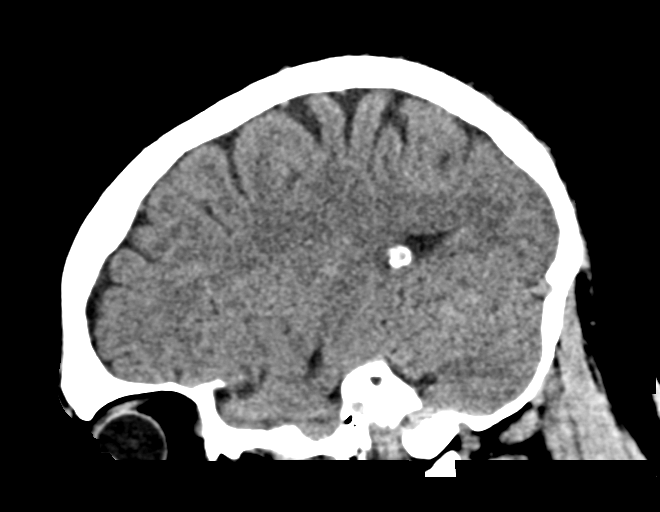

[15 of 47 positions shown; findings below may reference images not displayed]

FINDINGS: Brain: No acute intracranial abnormality. Specifically, no
hemorrhage, hydrocephalus, mass lesion, acute infarction, or
significant intracranial injury.

Vascular: No hyperdense vessel or unexpected calcification.

Skull: No acute calvarial abnormality.

Sinuses/Orbits: Visualized paranasal sinuses and mastoids clear.
Orbital soft tissues unremarkable.

Other: None
IMPRESSION: Normal study.

## 2021-07-22 ENCOUNTER — Encounter: Payer: Self-pay | Admitting: Family Medicine

## 2021-07-22 ENCOUNTER — Telehealth: Payer: 59 | Admitting: Family Medicine

## 2021-07-22 VITALS — BP 130/87 | Ht 72.0 in | Wt 200.0 lb

## 2021-07-22 DIAGNOSIS — E785 Hyperlipidemia, unspecified: Secondary | ICD-10-CM | POA: Diagnosis not present

## 2021-07-22 DIAGNOSIS — F3175 Bipolar disorder, in partial remission, most recent episode depressed: Secondary | ICD-10-CM | POA: Diagnosis not present

## 2021-07-22 DIAGNOSIS — I10 Essential (primary) hypertension: Secondary | ICD-10-CM | POA: Diagnosis not present

## 2021-07-22 MED ORDER — AMLODIPINE BESYLATE 5 MG PO TABS: 5.0000 mg | ORAL_TABLET | Freq: Every day | ORAL | 3 refills | Status: DC

## 2021-07-22 NOTE — Progress Notes (Signed)
Phone [phone] Virtual visit via Video note   Subjective:  Chief complaint: Chief Complaint  Patient presents with   Hypertension    Pt is needing bp meds refilled.     This visit type was conducted due to national recommendations for restrictions regarding the COVID-19 Pandemic (e.g. social distancing).  This format is felt to be most appropriate for this patient at this time balancing risks to patient and risks to population by having him in for in person visit.  No physical exam was performed (except for noted visual exam or audio findings with Telehealth visits).    Our team/I connected with Caleb Clayton at 11:20 AM EDT by a video enabled telemedicine application (doxy.me or caregility through epic) and verified that I am speaking with the correct person using two identifiers.  Location patient: [place] Location provider: [facility], office Persons participating in the virtual visit:  patient  Our team/I discussed the limitations of evaluation and management by telemedicine and the availability of in person appointments. In light of current covid-19 pandemic, patient also understands that we are trying to protect them by minimizing in office contact if at all possible.  The patient expressed consent for telemedicine visit and agreed to proceed. Patient understands insurance will be billed.   Past Medical History-  Patient Active Problem List   Diagnosis Date Noted   Bipolar disorder ([place]) 01/13/2014    Priority: High   STUTTERING 08/24/2009    Priority: High   Essential hypertension 09/04/2020    Priority: Medium    Hyperlipidemia, unspecified 06/18/2019    Priority: Medium    Snoring 05/08/2007    Priority: Low   SKIN CANCER, HX OF 11/20/2006    Priority: Low    Medications- reviewed and updated Current Outpatient Medications  Medication Sig Dispense Refill   benztropine (COGENTIN) 0.5 MG tablet Take 0.5 mg by mouth 2 (two) times daily.     clonazePAM  (KLONOPIN) 1 MG tablet Take 1 mg by mouth at bedtime as needed.     cyclobenzaprine (FLEXERIL) 5 MG tablet Take 1 tablet (5 mg total) by mouth at bedtime as needed for muscle spasms. 30 tablet 0   lamoTRIgine (LAMICTAL) 25 MG tablet Take 25 mg by mouth 2 (two) times daily.     scopolamine (TRANSDERM-SCOP) 1 MG/3DAYS Place 1 patch (1.5 mg total) onto the skin every 3 (three) days. 10 patch 0   sertraline (ZOLOFT) 100 MG tablet Take 100 mg by mouth daily.     amLODipine (NORVASC) 5 MG tablet Take 1 tablet (5 mg total) by mouth daily. 90 tablet 3   No current facility-administered medications for this visit.     Objective:  BP 130/87   Ht 6' (1.829 m)   Wt 200 lb (90.7 kg)   BMI 27.12 kg/m  self reported vitals Gen: NAD, resting comfortably Lungs: nonlabored, normal respiratory rate  Skin: appears dry, no obvious rash     Assessment and Plan   #social update- 2 vacations planned July and august  # Bipolar disorder S:Compliant with Latuda 60 mg, Cogentin 0.5 mg twice daily, sertraline 100 mg, Lamictal 25 mg twice a day through psychiatry.   -in past-  clonazepam at bedtime for sleep - no depressed mood or mania issues A/P: overall stable- continue close follow up with psychiatry- continue current meds   #hyperlipidemia S: Not currently on medication  Lab Results  Component Value Date   CHOL 184 11/03/2020   HDL 32.40 (L) 11/03/2020  [place] 136 (H) 11/03/2020   TRIG 76.0 11/03/2020   CHOLHDL 6 11/03/2020  A/P: The 10-year ASCVD risk score (Arnett DK, et al., 2019) is: 5.3%- no family history of cardiac issues at younger age- we will use s7.5% cutoff to consider further workup or meds- continue current meds for now   #hypertension S: medication: amlodipine 5 mg -he tried to refill online but apparently mychart suggested a visit BP Readings from Last 3 Encounters:  07/22/21 130/87  11/03/20 128/74  10/23/20 (!) 136/92  A/P: Controlled. Continue current medications.    #Back pain- at times has issues- does not need refill of cyclobenzaprine at this time but can refill a year from this visit- is going to be playing more tennis and wants to have on hand  Recommended follow up:  keep CPE in september Future Appointments  Date Time Provider [place]  11/04/2021  8:20 AM Marin Olp, MD [facility]    Lab/Order associations:   ICD-10-CM   1. Essential hypertension  I10     2. Hyperlipidemia, unspecified hyperlipidemia type  E78.5     3. Bipolar disorder, in partial remission, most recent episode depressed ([place])  F31.75       Meds ordered this encounter  Medications   amLODipine (NORVASC) 5 MG tablet    Sig: Take 1 tablet (5 mg total) by mouth daily.    Dispense:  90 tablet    Refill:  3    Return precautions advised.  Garret Reddish, MD

## 2021-08-02 ENCOUNTER — Encounter: Payer: Self-pay | Admitting: Family Medicine

## 2021-11-04 ENCOUNTER — Encounter: Payer: 59 | Admitting: Family Medicine

## 2021-11-11 ENCOUNTER — Encounter: Payer: Self-pay | Admitting: Family Medicine

## 2021-11-11 ENCOUNTER — Ambulatory Visit: Payer: 59 | Admitting: Family Medicine

## 2021-11-11 VITALS — BP 118/78 | HR 68 | Temp 98.0°F | Ht 72.0 in | Wt 224.6 lb

## 2021-11-11 DIAGNOSIS — E785 Hyperlipidemia, unspecified: Secondary | ICD-10-CM

## 2021-11-11 DIAGNOSIS — F3176 Bipolar disorder, in full remission, most recent episode depressed: Secondary | ICD-10-CM | POA: Diagnosis not present

## 2021-11-11 DIAGNOSIS — I10 Essential (primary) hypertension: Secondary | ICD-10-CM | POA: Diagnosis not present

## 2021-11-11 LAB — LIPID PANEL
Cholesterol: 157 mg/dL (ref 0–200)
HDL: 49.2 mg/dL (ref >39.00)
LDL Cholesterol: 92 mg/dL (ref 0–99)
NonHDL: 107.99
Total CHOL/HDL Ratio: 3
Triglycerides: 79 mg/dL (ref 0.0–149.0)
VLDL: 15.8 mg/dL (ref 0.0–40.0)

## 2021-11-11 LAB — CBC WITH DIFFERENTIAL/PLATELET
Basophils Absolute: 0.1 10*3/uL (ref 0.0–0.1)
Basophils Relative: 1.2 % (ref 0.0–3.0)
Eosinophils Absolute: 0.2 10*3/uL (ref 0.0–0.7)
Eosinophils Relative: 2.9 % (ref 0.0–5.0)
HCT: 42.2 % (ref 39.0–52.0)
Hemoglobin: 14.5 g/dL (ref 13.0–17.0)
Lymphocytes Relative: 31 % (ref 12.0–46.0)
Lymphs Abs: 1.8 10*3/uL (ref 0.7–4.0)
MCHC: 34.3 g/dL (ref 30.0–36.0)
MCV: 89.3 fl (ref 78.0–100.0)
Monocytes Absolute: 0.4 10*3/uL (ref 0.1–1.0)
Monocytes Relative: 7.1 % (ref 3.0–12.0)
Neutro Abs: 3.4 10*3/uL (ref 1.4–7.7)
Neutrophils Relative %: 57.8 % (ref 43.0–77.0)
Platelets: 187 10*3/uL (ref 150.0–400.0)
RBC: 4.72 Mil/uL (ref 4.22–5.81)
RDW: 13.1 % (ref 11.5–15.5)
WBC: 5.8 10*3/uL (ref 4.0–10.5)

## 2021-11-11 LAB — COMPREHENSIVE METABOLIC PANEL
ALT: 23 U/L (ref 0–53)
AST: 22 U/L (ref 0–37)
Albumin: 4.6 g/dL (ref 3.5–5.2)
Alkaline Phosphatase: 62 U/L (ref 39–117)
BUN: 12 mg/dL (ref 6–23)
CO2: 27 mEq/L (ref 19–32)
Calcium: 9.2 mg/dL (ref 8.4–10.5)
Chloride: 103 mEq/L (ref 96–112)
Creatinine, Ser: 1.04 mg/dL (ref 0.40–1.50)
GFR: 84.38 mL/min (ref >60.00)
Glucose, Bld: 97 mg/dL (ref 70–99)
Potassium: 3.8 mEq/L (ref 3.5–5.1)
Sodium: 140 mEq/L (ref 135–145)
Total Bilirubin: 0.5 mg/dL (ref 0.2–1.2)
Total Protein: 7.4 g/dL (ref 6.0–8.3)

## 2021-11-11 LAB — TSH: TSH: 1.81 u[IU]/mL (ref 0.35–5.50)

## 2021-11-11 NOTE — Progress Notes (Signed)
Phone: [phone]    Subjective:  Patient presents today for their annual physical. Chief complaint-noted.   See problem oriented charting- ROS- full  review of systems was completed and negative  except for: weight gain  The following were reviewed and entered/updated in epic: Past Medical History:  Diagnosis Date   Bipolar disorder ([place])    Depression    Heart murmur    History of skin cancer    Stutter    Patient Active Problem List   Diagnosis Date Noted   Bipolar disorder ([place]) 01/13/2014    Priority: High   STUTTERING 08/24/2009    Priority: High   Essential hypertension 09/04/2020    Priority: Medium    Hyperlipidemia, unspecified 06/18/2019    Priority: Medium    Snoring 05/08/2007    Priority: Low   SKIN CANCER, HX OF 11/20/2006    Priority: Low   Past Surgical History:  Procedure Laterality Date   arm fx     HERNIA REPAIR     9 mo old   mole bx     WRIST FRACTURE SURGERY  1985, 1989    Family History  Problem Relation Age of Onset   Hyperlipidemia Mother        father   Hypertension Mother        father   Colon polyps Mother    Stomach cancer Mother    Depression Father    Melanoma Father    Lung cancer Father    Colon polyps Father    Prostate cancer Paternal Grandfather        late in life   Bipolar disorder Neg Hx    Colon cancer Neg Hx    Esophageal cancer Neg Hx    Rectal cancer Neg Hx     Medications- reviewed and updated Current Outpatient Medications  Medication Sig Dispense Refill   amLODipine (NORVASC) 5 MG tablet Take 1 tablet (5 mg total) by mouth daily. 90 tablet 3   benztropine (COGENTIN) 0.5 MG tablet Take 0.5 mg by mouth 2 (two) times daily.     clonazePAM (KLONOPIN) 1 MG tablet Take 1 mg by mouth at bedtime as needed.     cyclobenzaprine (FLEXERIL) 5 MG tablet Take 1 tablet (5 mg total) by mouth at bedtime as needed for muscle spasms. 30 tablet 0   lamoTRIgine (LAMICTAL) 25 MG tablet Take 25 mg by mouth 2 (two) times  daily.     scopolamine (TRANSDERM-SCOP) 1 MG/3DAYS Place 1 patch (1.5 mg total) onto the skin every 3 (three) days. 10 patch 0   sertraline (ZOLOFT) 100 MG tablet Take 100 mg by mouth daily.     No current facility-administered medications for this visit.    Allergies-reviewed and updated No Known Allergies  Social History   Social History Narrative   Married. 2 kids 21 and 15 in 2022.       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       Hobbies: yardwork, basketball     Objective:  BP 118/78   Pulse 68   Temp 98 F (36.7 C)   Ht 6' (1.829 m)   Wt 224 lb 9.6 oz (101.9 kg)   SpO2 98%   BMI 30.46 kg/m  Gen: NAD, resting comfortably HEENT: Mucous membranes are moist. Oropharynx normal Neck: no obvious thyromegaly CV: RRR no murmurs rubs or gallops Lungs: CTAB no crackles, wheeze, rhonchi Abdomen: soft/nontender/nondistended/normal  bowel sounds. No rebound or guarding.  Ext: no edema Skin: warm, dry Neuro: grossly normal, moves all extremities, PERRLA    Assessment and Plan:  51 y.o. male presenting for annual physical.  Health Maintenance counseling: 1. Anticipatory guidance: Patient counseled regarding regular dental exams -q6 months, eye exams - yearly typically- scheduled for next thursday,  avoiding smoking and second hand smoke , limiting alcohol to 2 beverages per day- doesn't drink, no illicit drugs.   2. Risk factor reduction:  Advised patient of need for regular exercise and diet rich and fruits and vegetables to reduce risk of heart attack and stroke.  Exercise-playing tennis once a week but may play more, walking at lunch at work for 20 minutes per session.  Diet/weight management-from July of last year he is down 46 pounds and 23 pounds from last physical as of May-today up slightly 24 lbs from last visit.had been with [place] weight loss before but was adding in some extra foods- such as soda  once a day and adding water. Mergers have been stressful and worries about money plus kids driving or close to driving. Advised trial of myfitnesspal with goal 1/2 lb a week weight loss Wt Readings from Last 3 Encounters:  11/11/21 224 lb 9.6 oz (101.9 kg)  07/22/21 200 lb (90.7 kg)  11/03/20 223 lb (101.2 kg)  3. Immunizations/screenings/ancillary studies-recommended flu shot (plans later in season), opts out of hepatitis C screening, COVID shot- leaning toward updating once updated  Immunization History  Administered Date(s) Administered   Influenza Split 12/05/2019   Influenza-Unspecified 12/07/2013, 12/01/2016, 12/06/2018   PFIZER(Purple Top)SARS-COV-2 Vaccination 03/13/2019, 04/03/2019, 11/29/2019   Pfizer Covid-19 Vaccine Bivalent Booster 24yr & up 12/21/2020   Td 11/20/2006   Tdap 06/18/2019   4. Prostate cancer screening-  no early family history, start at age 51   5 Colon cancer screening - 07/30/2019 with 3-year repeat planned due to tubular adenomas 6. Skin cancer screening/prevention-father with history of melanoma and he sees dermatology yearly typically- plans to schedule with [facility]. advised regular sunscreen use. Denies worrisome, changing, or new skin lesions.  7. Testicular cancer screening- advised monthly self exams  8. STD screening- patient opts out as monogamous 9. Smoking associated screening-never smoker- father passed of lung cancer in September 2020 but was a smoker  Status of chronic or acute concerns   # Bipolar disorder S:Compliant with Cogentin 0.5 mg twice daily, sertraline 100 mg, Lamictal 25 mg twice a day through psychiatry.   -clonazepam at bedtime for sleep A/P: he reports well controlled- no depression or mania. No SI. Willc ontinue current meds and psychiatry follow up    #hyperlipidemia S: Not currently on medication  The 10-year ASCVD risk score (Arnett DK, et al., 2019) is: 4.5%  Lab Results  Component Value Date   CHOL 184 11/03/2020    HDL 32.40 (L) 11/03/2020   LDLCALC 136 (H) 11/03/2020   TRIG 76.0 11/03/2020   CHOLHDL 6 11/03/2020    A/P: update lipids- and calculate ascvd risk- if gets above 7.5% consider ct cardiac  #Hypertension S: medication: amlodipine 5 mg Home readings #s: rarely checks  BP Readings from Last 3 Encounters:  11/11/21 118/78  07/22/21 130/87  11/03/20 128/74   A/P: Controlled. Continue current medications.   #Back pain-Flexeril as needed-can refill before next visit. No recent issues  Recommended follow up: Return in about 6 months (around 05/12/2022) for followup or sooner if needed.Schedule b4 you leave.  Lab/Order associations: FASTING  ICD-10-CM   1. Essential hypertension  I10     2. Hyperlipidemia, unspecified hyperlipidemia type  E78.5 CBC with Differential/Platelet    Comprehensive metabolic panel    Lipid panel    TSH    3. Bipolar disorder, in full remission, most recent episode depressed ([place])  F31.76       No orders of the defined types were placed in this encounter.   Return precautions advised.  Garret Reddish, MD

## 2021-11-11 NOTE — Patient Instructions (Addendum)
Let us know when you get your flu shot.  Set a goal of 5 lbs at least weight loss between now and next visit- mild sustainable long term weight loss is the goal for you  Please stop by lab before you go If you have mychart- we will send your results within 3 business days of [place] receiving them.  If you do not have mychart- we will call you about results within 5 business days of [place] receiving them.  *please also note that you will see labs on mychart as soon as they post. I will later go in and write notes on them- will say "notes from Dr. Yong Channel"   Recommended follow up: Return in about 6 months (around 05/12/2022) for followup or sooner if needed.Schedule b4 you leave.

## 2021-11-12 ENCOUNTER — Encounter: Payer: Self-pay | Admitting: Family Medicine

## 2021-11-15 ENCOUNTER — Other Ambulatory Visit: Payer: Self-pay

## 2021-11-15 DIAGNOSIS — E049 Nontoxic goiter, unspecified: Secondary | ICD-10-CM

## 2021-11-16 ENCOUNTER — Ambulatory Visit
Admission: RE | Admit: 2021-11-16 | Discharge: 2021-11-16 | Disposition: A | Discharge status: Home / Self Care | Payer: 59 | Source: Ambulatory Visit | Attending: Family Medicine | Admitting: Family Medicine

## 2021-11-16 ENCOUNTER — Encounter: Payer: Self-pay | Admitting: Family Medicine

## 2021-11-16 DIAGNOSIS — E049 Nontoxic goiter, unspecified: Secondary | ICD-10-CM | POA: Insufficient documentation

## 2022-02-22 ENCOUNTER — Encounter: Payer: Self-pay | Admitting: Family

## 2022-02-22 ENCOUNTER — Ambulatory Visit: Payer: 59 | Admitting: Family

## 2022-02-22 VITALS — BP 126/81 | HR 72 | Temp 97.6°F | Ht 72.0 in | Wt 238.2 lb

## 2022-02-22 DIAGNOSIS — J029 Acute pharyngitis, unspecified: Secondary | ICD-10-CM | POA: Diagnosis not present

## 2022-02-22 LAB — POCT RAPID STREP A (OFFICE): Rapid Strep A Screen: NEGATIVE

## 2022-02-22 NOTE — Progress Notes (Signed)
Patient ID: Caleb Clayton, male    DOB: 1970/09/17, 51 y.o.   MRN: [id number]  Chief Complaint  Patient presents with   Sinus Problem    Pt c/o sore throat and lymph nodes pain and fatigue,Present since last Saturday. Has not tried anything for symptoms.     HPI:      Sore throat:  pt reports feeling pain in his throat when he swallows liquids or foods, denies feeling scratchy throat. Denies fever, sinus drainage, or cough, but has had nasal congestion. He reports having swelling his throat a few months ago and had a thyroid scan done which indicated mild heterogeniety, hormone levels wnl (per review of chart notes).      Assessment & Plan:  1. Sore throat - rapid strep neg, difficult to discern if viral syndrome vs structural problem. Thyroid scan negative a few months ago. Pt states scan done for swelling not d/t pain at that time.  Advised pt to take up to '600mg'$  of Ibuprofen tid or 1-2 generic Aleve bid to help with swelling and pain over the next 4-5 days.. If this does not help his sx at all, advised pt to notify his PCP. Use normal saline spray and humidifier overnight for nasal congestion.  - POCT rapid strep A    Subjective:    Outpatient Medications Prior to Visit  Medication Sig Dispense Refill   amLODipine (NORVASC) 5 MG tablet Take 1 tablet (5 mg total) by mouth daily. 90 tablet 3   benztropine (COGENTIN) 0.5 MG tablet Take 0.5 mg by mouth 2 (two) times daily.     clonazePAM (KLONOPIN) 1 MG tablet Take 1 mg by mouth at bedtime as needed.     cyclobenzaprine (FLEXERIL) 5 MG tablet Take 1 tablet (5 mg total) by mouth at bedtime as needed for muscle spasms. 30 tablet 0   lamoTRIgine (LAMICTAL) 25 MG tablet Take 25 mg by mouth 2 (two) times daily.     scopolamine (TRANSDERM-SCOP) 1 MG/3DAYS Place 1 patch (1.5 mg total) onto the skin every 3 (three) days. 10 patch 0   sertraline (ZOLOFT) 100 MG tablet Take 100 mg by mouth daily.     No facility-administered medications  prior to visit.   Past Medical History:  Diagnosis Date   Bipolar disorder ([place])    Depression    Heart murmur    History of skin cancer    Stutter    Past Surgical History:  Procedure Laterality Date   arm fx     HERNIA REPAIR     79 mo old   mole bx     WRIST FRACTURE SURGERY  1985, 1989   No Known Allergies    Objective:    Physical Exam Vitals and nursing note reviewed.  Constitutional:      General: He is not in acute distress.    Appearance: Normal appearance.  HENT:     Head: Normocephalic.     Mouth/Throat:     Mouth: Mucous membranes are moist.     Pharynx: Posterior oropharyngeal erythema (mild) present. No pharyngeal swelling, oropharyngeal exudate or uvula swelling.     Comments: Tissue swelling noted on external neck, pt states it is tender to palpation around thyroid location and when palpating along his cervical lymph node chain, but no palpable nodes noted. Also reports tenderness with neck extension, but not w/rotation or flexion. Cardiovascular:     Rate and Rhythm: Normal rate and regular rhythm.  Pulmonary:  Effort: Pulmonary effort is normal.     Breath sounds: Normal breath sounds.  Musculoskeletal:        General: Normal range of motion.     Cervical back: Normal range of motion.  Skin:    General: Skin is warm and dry.  Neurological:     Mental Status: He is alert and oriented to person, place, and time.  Psychiatric:        Mood and Affect: Mood normal.    BP 126/81 (BP Location: Left Arm, Patient Position: Sitting, Cuff Size: Large)   Pulse 72   Temp 97.6 F (36.4 C) (Temporal)   Ht 6' (1.829 m)   Wt 238 lb 4 oz (108.1 kg)   SpO2 97%   BMI 32.31 kg/m  Wt Readings from Last 3 Encounters:  02/22/22 238 lb 4 oz (108.1 kg)  11/11/21 224 lb 9.6 oz (101.9 kg)  07/22/21 200 lb (90.7 kg)       Jeanie Sewer, NP

## 2022-03-29 ENCOUNTER — Encounter: Payer: Self-pay | Admitting: Family Medicine

## 2022-04-11 ENCOUNTER — Encounter: Payer: Self-pay | Admitting: Family Medicine

## 2022-04-11 ENCOUNTER — Ambulatory Visit: Payer: 59 | Admitting: Family Medicine

## 2022-04-11 VITALS — BP 130/80 | HR 69 | Temp 97.8°F | Ht 72.0 in | Wt 231.6 lb

## 2022-04-11 DIAGNOSIS — E785 Hyperlipidemia, unspecified: Secondary | ICD-10-CM | POA: Diagnosis not present

## 2022-04-11 DIAGNOSIS — I10 Essential (primary) hypertension: Secondary | ICD-10-CM

## 2022-04-11 DIAGNOSIS — F3176 Bipolar disorder, in full remission, most recent episode depressed: Secondary | ICD-10-CM

## 2022-04-11 MED ORDER — LAMOTRIGINE 25 MG PO TABS: 25.0000 mg | ORAL_TABLET | Freq: Two times a day (BID) | ORAL | 11 refills | Status: DC

## 2022-04-11 MED ORDER — SERTRALINE HCL 100 MG PO TABS: 100.0000 mg | ORAL_TABLET | Freq: Every day | ORAL | 11 refills | Status: DC

## 2022-04-11 MED ORDER — BENZTROPINE MESYLATE 0.5 MG PO TABS: 0.5000 mg | ORAL_TABLET | Freq: Two times a day (BID) | ORAL | 11 refills | Status: DC

## 2022-04-11 NOTE — Patient Instructions (Addendum)
Sign release of information at the check out desk for records from psychiatry office for last 3 years  Glad you are doing well- hope transition of full remote goes well!  Recommended follow up: Return in about 8 months (around 12/10/2022) for physical or sooner if needed.Schedule b4 you leave.

## 2022-04-11 NOTE — Progress Notes (Signed)
Phone [phone] In person visit   Subjective:   Caleb Clayton is a 52 y.o. year old very pleasant male patient who presents for/with See problem oriented charting Chief Complaint  Patient presents with   Follow-up    Pt is here to f/u on medications.    Past Medical History-  Patient Active Problem List   Diagnosis Date Noted   Bipolar disorder ([place]) 01/13/2014    Priority: High   STUTTERING 08/24/2009    Priority: High   Essential hypertension 09/04/2020    Priority: Medium    Hyperlipidemia, unspecified 06/18/2019    Priority: Medium    Snoring 05/08/2007    Priority: Low   SKIN CANCER, HX OF 11/20/2006    Priority: Low   Goiter 11/16/2021    Medications- reviewed and updated Current Outpatient Medications  Medication Sig Dispense Refill   amLODipine (NORVASC) 5 MG tablet Take 1 tablet (5 mg total) by mouth daily. 90 tablet 3   clonazePAM (KLONOPIN) 1 MG tablet Take 1 mg by mouth at bedtime as needed.     benztropine (COGENTIN) 0.5 MG tablet Take 1 tablet (0.5 mg total) by mouth 2 (two) times daily. 62 tablet 11   lamoTRIgine (LAMICTAL) 25 MG tablet Take 1 tablet (25 mg total) by mouth 2 (two) times daily. 62 tablet 11   sertraline (ZOLOFT) 100 MG tablet Take 1 tablet (100 mg total) by mouth daily. 31 tablet 11   No current facility-administered medications for this visit.     Objective:  BP 130/80   Pulse 69   Temp 97.8 F (36.6 C)   Ht 6' (1.829 m)   Wt 231 lb 9.6 oz (105.1 kg)   SpO2 97%   BMI 31.41 kg/m  Gen: NAD, resting comfortably CV: RRR no murmurs rubs or gallops Lungs: CTAB no crackles, wheeze, rhonchi Ext: no edema Skin: warm, dry     Assessment and Plan   # Bipolar disorder- was seeing [facility] but no longer accepting his insurance Crystal Mytock (sp?) NP S:Compliant with Cogentin 0.5 mg twice daily (tremor and neck stiffness), sertraline 100 mg, Lamictal 25 mg twice a day through psychiatry.   -prn clonazepam at bedtime  for sleep- perhaps once a week -latuda in past but psychiatry stopped  Some stress with work but denies depression- plus 2 teenage daughters in high school -did gain some weight back but started on weight watchers and seeing slow decline again    04/11/2022    3:49 PM 11/03/2020   11:16 AM 07/30/2020    1:37 PM  Depression screen PHQ 2/9  Decreased Interest 0 0 0  Down, Depressed, Hopeless 1  0  PHQ - 2 Score 1 0 0  Altered sleeping 1  1  Tired, decreased energy 1  3  Change in appetite 2  0  Feeling bad or failure about yourself  0  0  Trouble concentrating 0  0  Moving slowly or fidgety/restless 0  0  Suicidal thoughts 0  0  PHQ-9 Score 5  4  Difficult doing work/chores Not difficult at all  Not difficult at all  No hypomania reported A/P: Bipolar II stable- PHQ9 minimally elevated but this is due to stress with work and caring for his family including 2 teenage daughters- we will take over prescription but try to get records from psychiatry- sending in as below and I can send in clonazepam at later date when he needs again- he will let us know- he can  reference this visit- does not need another visit for the clonazepam   #hyperlipidemia S: Not currently on medication . Had success with weight loss- see above had rgained some but now working with weight watchers Lab Results  Component Value Date   CHOL 157 11/11/2021   HDL 49.20 11/11/2021   LDLCALC 92 11/11/2021   TRIG 79.0 11/11/2021   CHOLHDL 3 11/11/2021   The 10-year ASCVD risk score (Arnett DK, et al., 2019) is: 2.7%   A/P: Has actually had improvement in the last year-ASCVD risk only 3%-remain off medication unless risk above 7.5% and consider further workup   #hypertension S: medication: amlodipine 5 mg Home readings #s: no recent check BP Readings from Last 3 Encounters:  04/11/22 130/80  02/22/22 126/81  11/11/21 118/78    A/P: did have a couple readings today with diastolic right around 90 but initial was at 80-  for now continue current medications- encouraged him to check at home- most ideal target <130/80- but tolerate up to 135/85- he will let me know if above this on home checks  Recommended follow up: Return in about 8 months (around 12/10/2022) for physical or sooner if needed.Schedule b4 you leave.  Lab/Order associations:   ICD-10-CM   1. Bipolar disorder, in full remission, most recent episode depressed ([facility])  F31.76 benztropine (COGENTIN) 0.5 MG tablet    sertraline (ZOLOFT) 100 MG tablet    lamoTRIgine (LAMICTAL) 25 MG tablet    2. Essential hypertension  I10     3. Hyperlipidemia, unspecified hyperlipidemia type  E78.5       Meds ordered this encounter  Medications   benztropine (COGENTIN) 0.5 MG tablet    Sig: Take 1 tablet (0.5 mg total) by mouth 2 (two) times daily.    Dispense:  62 tablet    Refill:  11   sertraline (ZOLOFT) 100 MG tablet    Sig: Take 1 tablet (100 mg total) by mouth daily.    Dispense:  31 tablet    Refill:  11   lamoTRIgine (LAMICTAL) 25 MG tablet    Sig: Take 1 tablet (25 mg total) by mouth 2 (two) times daily.    Dispense:  62 tablet    Refill:  11    Return precautions advised.  Garret Reddish, MD

## 2022-04-18 ENCOUNTER — Encounter: Payer: Self-pay | Admitting: Family Medicine

## 2022-04-25 MED ORDER — CLONAZEPAM 1 MG PO TABS: 1.0000 mg | ORAL_TABLET | Freq: Every evening | ORAL | 5 refills | Status: DC | PRN

## 2022-07-15 ENCOUNTER — Other Ambulatory Visit: Payer: Self-pay | Admitting: Family Medicine

## 2022-08-09 ENCOUNTER — Encounter: Payer: Self-pay | Admitting: Internal Medicine

## 2022-09-30 ENCOUNTER — Ambulatory Visit (INDEPENDENT_AMBULATORY_CARE_PROVIDER_SITE_OTHER)
Admission: RE | Admit: 2022-09-30 | Discharge: 2022-09-30 | Disposition: A | Discharge status: Home / Self Care | Payer: 59 | Source: Ambulatory Visit | Attending: Family | Admitting: Family

## 2022-09-30 ENCOUNTER — Ambulatory Visit: Payer: 59 | Admitting: Family

## 2022-09-30 VITALS — BP 134/83 | HR 64 | Ht 72.0 in | Wt 232.0 lb

## 2022-09-30 DIAGNOSIS — S6990XA Unspecified injury of unspecified wrist, hand and finger(s), initial encounter: Secondary | ICD-10-CM

## 2022-09-30 DIAGNOSIS — S6992XA Unspecified injury of left wrist, hand and finger(s), initial encounter: Secondary | ICD-10-CM

## 2022-09-30 MED ORDER — DOXYCYCLINE HYCLATE 100 MG PO TABS
100.0000 mg | ORAL_TABLET | Freq: Two times a day (BID) | ORAL | 0 refills | Status: AC
Start: 2022-09-30 — End: 2022-10-05

## 2022-09-30 NOTE — Progress Notes (Unsigned)
Patient ID: Caleb Clayton, male    DOB: September 28, 1970, 52 y.o.   MRN: [id number]  Chief Complaint  Patient presents with   Hand Pain    sx for 3d   HPI:      Injured finger:  Pt states On 7/30, his left middle finger was jammed in a painting roller for over a minute before released. Pts  finger is swollen and finger nail has darkened and is very painful, throbbing.   Assessment & Plan:  1. Finger injury, initial encounter- Diagnosis: {dx:34196} - Location: {finger/toenails:34193} Procedure: Incision & drainage Informed consent:  Discussed risks (permanent loss of nail, permanent irregular growth of nail, infection, pain, bleeding, bruising, numbness, and recurrence of the condition) and benefits of the procedure, as well as the alternatives.  Informed consent was obtained. Anesthesia: *** Type: {total/partial:34194} The area was prepared and draped in a standard fashion. {procedure details:34197} The patient tolerated the procedure well. The patient was instructed on post-op care.  - DG Finger Middle Left; Future - Incision and drainage; Future - doxycycline (VIBRA-TABS) 100 MG tablet; Take 1 tablet (100 mg total) by mouth 2 (two) times daily for 5 days.  Dispense: 10 tablet; Refill: 0    Subjective:    Outpatient Medications Prior to Visit  Medication Sig Dispense Refill   amLODipine (NORVASC) 5 MG tablet TAKE 1 TABLET (5 MG TOTAL) BY MOUTH DAILY. 30 tablet 11   benztropine (COGENTIN) 0.5 MG tablet Take 1 tablet (0.5 mg total) by mouth 2 (two) times daily. 62 tablet 11   clonazePAM (KLONOPIN) 1 MG tablet Take 1 tablet (1 mg total) by mouth at bedtime as needed (Do not drive for a minimum of 8 hours after taking). 30 tablet 5   lamoTRIgine (LAMICTAL) 25 MG tablet Take 1 tablet (25 mg total) by mouth 2 (two) times daily. 62 tablet 11   sertraline (ZOLOFT) 100 MG tablet Take 1 tablet (100 mg total) by mouth daily. 31 tablet 11   No facility-administered medications prior to  visit.   Past Medical History:  Diagnosis Date   Bipolar disorder ([facility])    Depression    Heart murmur    History of skin cancer    Stutter    Past Surgical History:  Procedure Laterality Date   arm fx     HERNIA REPAIR     76 mo old   mole bx     WRIST FRACTURE SURGERY  1985, 1989   No Known Allergies    Objective:    Physical Exam Vitals and nursing note reviewed.  Constitutional:      General: He is not in acute distress.    Appearance: Normal appearance.  HENT:     Head: Normocephalic.  Cardiovascular:     Rate and Rhythm: Normal rate and regular rhythm.  Pulmonary:     Effort: Pulmonary effort is normal.     Breath sounds: Normal breath sounds.  Musculoskeletal:        General: Normal range of motion.     Cervical back: Normal range of motion.  Skin:    General: Skin is warm and dry.  Neurological:     Mental Status: He is alert and oriented to person, place, and time.  Psychiatric:        Mood and Affect: Mood normal.    BP 134/83 (BP Location: Left Arm, Patient Position: Sitting, Cuff Size: Large)   Pulse 64   Ht 6' (1.829 m)   Wt 232  lb (105.2 kg)   SpO2 97%   BMI 31.46 kg/m  Wt Readings from Last 3 Encounters:  09/30/22 232 lb (105.2 kg)  04/11/22 231 lb 9.6 oz (105.1 kg)  02/22/22 238 lb 4 oz (108.1 kg)       Dulce Sellar, NP

## 2022-10-04 ENCOUNTER — Ambulatory Visit: Payer: 59

## 2022-11-06 ENCOUNTER — Other Ambulatory Visit: Payer: Self-pay | Admitting: Family Medicine

## 2023-01-05 ENCOUNTER — Ambulatory Visit (INDEPENDENT_AMBULATORY_CARE_PROVIDER_SITE_OTHER): Payer: 59

## 2023-01-05 DIAGNOSIS — Z23 Encounter for immunization: Secondary | ICD-10-CM

## 2023-02-01 ENCOUNTER — Encounter: Payer: Self-pay | Admitting: Family

## 2023-02-01 ENCOUNTER — Ambulatory Visit: Payer: 59 | Admitting: Family

## 2023-02-01 VITALS — BP 141/98 | HR 84 | Temp 97.5°F | Ht 72.0 in | Wt 245.4 lb

## 2023-02-01 DIAGNOSIS — B349 Viral infection, unspecified: Secondary | ICD-10-CM | POA: Diagnosis not present

## 2023-02-01 NOTE — Progress Notes (Signed)
Patient ID: Caleb Clayton, male    DOB: 1970/09/15, 52 y.o.   MRN: [id number]  Chief Complaint  Patient presents with  . Sore Throat    Pt c/o of sore throat on Monday 01/30/23, pt has tried DayQuil, Tried last night and helped some, pt had COVID 3 months ago  . Nasal Congestion    Low grade fever on 01/30/23, has not taken COVID test, declines testing  . Generalized Body Aches    Aches became worse last night.   Marland Kitchen URI    Discussed the use of AI scribe software for clinical note transcription with the patient, who gave verbal consent to proceed.  History of Present Illness   The patient, with a history of hypertension managed with amlodipine, presents with upper respiratory symptoms for the last 2 days. He reports sinus congestion and chest discomfort, which he has been managing with over-the-counter Dayquil. He has not taken any today in anticipation of the appointment. He also reports a low-grade fever of 99 degrees Fahrenheit earlier in the week, but no fever since. He had a probable COVID-19 infection approximately three months ago, but did not receive antiviral treatment. He has received the flu vaccine this year.  The patient's blood pressure was found to be elevated during the visit. He reports a history of high blood pressure, which was previously managed with amlodipine. He had stopped taking the medication after losing weight and seeing an improvement in his blood pressure, but has recently gained the weight back and has restarted the amlodipine. He is unsure of the dosage but believes it is the lowest available. He does not regularly monitor his blood pressure at home.      Assessment & Plan:     Upper Respiratory Infection - Symptoms of sinus congestion and chest discomfort. Lungs clear on auscultation. -Recommend over-the-counter generic Aleve 2 tabs bid for up to 5d for inflammation and symptom relief. -Advised on use of OTC generic Nasacort (steroid nasal spray) to help  control sinus symptoms, 1 spray each nostril bid x3d, then daily until sx are resolved. -Encourage use of saline spray and humidifier for sx relief. -Suggested supplementation with zinc and vitamin C to boost immune system up to 7days. -Advise follow-up if symptoms do not improve by next week.  Hypertension - History of high blood pressure managed with amlodipine. Recent weight gain has led to increased blood pressure readings. Currently taking amlodipine nightly. -Advised patient to monitor blood pressure at home. -Encourage low salt diet and increased water intake, especially while ill. -Notify PCP of elevated home blood pressure readings.   Subjective:    Outpatient Medications Prior to Visit  Medication Sig Dispense Refill  . amLODipine (NORVASC) 5 MG tablet TAKE 1 TABLET (5 MG TOTAL) BY MOUTH DAILY. 30 tablet 11  . benztropine (COGENTIN) 0.5 MG tablet Take 1 tablet (0.5 mg total) by mouth 2 (two) times daily. 62 tablet 11  . clonazePAM (KLONOPIN) 1 MG tablet TAKE 1 TABLET (1 MG TOTAL) BY MOUTH AT BEDTIME AS NEEDED (DO NOT DRIVE FOR A MINIMUM OF 8 HOURS AFTER TAKING). 30 tablet 5  . lamoTRIgine (LAMICTAL) 25 MG tablet Take 1 tablet (25 mg total) by mouth 2 (two) times daily. 62 tablet 11  . sertraline (ZOLOFT) 100 MG tablet Take 1 tablet (100 mg total) by mouth daily. 31 tablet 11   No facility-administered medications prior to visit.   Past Medical History:  Diagnosis Date  . Bipolar disorder (HCC)   .  Depression   . Heart murmur   . History of skin cancer   . Stutter    Past Surgical History:  Procedure Laterality Date  . arm fx    . HERNIA REPAIR     32 mo old  . mole bx    . WRIST FRACTURE SURGERY  1985, 1989   No Known Allergies    Objective:    Physical Exam Vitals and nursing note reviewed.  Constitutional:      General: He is not in acute distress.    Appearance: Normal appearance. He is not ill-appearing.  HENT:     Head: Normocephalic.     Right Ear:  Tympanic membrane and ear canal normal.     Left Ear: Tympanic membrane and ear canal normal.     Nose:     Right Sinus: No maxillary sinus tenderness or frontal sinus tenderness (pressure).     Left Sinus: No maxillary sinus tenderness or frontal sinus tenderness (pressure).     Mouth/Throat:     Mouth: Mucous membranes are moist.     Pharynx: No pharyngeal swelling, oropharyngeal exudate, posterior oropharyngeal erythema or uvula swelling.     Tonsils: No tonsillar exudate or tonsillar abscesses.  Cardiovascular:     Rate and Rhythm: Normal rate and regular rhythm.  Pulmonary:     Effort: Pulmonary effort is normal.     Breath sounds: Normal breath sounds.  Musculoskeletal:        General: Normal range of motion.     Cervical back: Normal range of motion.  Lymphadenopathy:     Head:     Right side of head: No preauricular or posterior auricular adenopathy.     Left side of head: No preauricular or posterior auricular adenopathy.     Cervical: No cervical adenopathy.  Skin:    General: Skin is warm and dry.  Neurological:     Mental Status: He is alert and oriented to person, place, and time.  Psychiatric:        Mood and Affect: Mood normal.   BP (!) 141/98   Pulse 84   Temp (!) 97.5 F (36.4 C)   Ht 6' (1.829 m)   Wt 245 lb 6.4 oz (111.3 kg)   SpO2 97%   BMI 33.28 kg/m  Wt Readings from Last 3 Encounters:  02/01/23 245 lb 6.4 oz (111.3 kg)  09/30/22 232 lb (105.2 kg)  04/11/22 231 lb 9.6 oz (105.1 kg)       Dulce Sellar, NP

## 2023-02-03 ENCOUNTER — Encounter: Payer: Self-pay | Admitting: Family

## 2023-02-03 ENCOUNTER — Telehealth: Payer: Self-pay | Admitting: Family Medicine

## 2023-02-03 DIAGNOSIS — Z1211 Encounter for screening for malignant neoplasm of colon: Secondary | ICD-10-CM

## 2023-02-03 NOTE — Telephone Encounter (Signed)
Reason for Referral Request:  Previous polyps  Has Patient been seen by PCP for this complaint?  No, Please schedule patient for appointment for complaint.  Yes, Please find out following information:  Reason: Colonoscopy due to previous polyps  Referral to which Specialty: Gastroenterology  Preferred office/ provider:  Did not specify

## 2023-02-03 NOTE — Telephone Encounter (Signed)
Referral has been placed. 

## 2023-03-08 ENCOUNTER — Ambulatory Visit (INDEPENDENT_AMBULATORY_CARE_PROVIDER_SITE_OTHER): Payer: 59

## 2023-03-08 ENCOUNTER — Telehealth: Payer: Self-pay

## 2023-03-08 DIAGNOSIS — Z23 Encounter for immunization: Secondary | ICD-10-CM

## 2023-03-08 NOTE — Progress Notes (Signed)
 Patient is in office today for a nurse visit for  Shingles #2 , per PCP's order. Patient Injection was given in the  Left deltoid. Patient tolerated injection well.

## 2023-03-08 NOTE — Telephone Encounter (Signed)
 Advice Only (Pt presents with nurse visit for 2nd Shingles vaccine. Pt states that his BP has been running high at home. In office today pt's BP was 154/100. Pt currently taking low dose BP medication and has CPE scheduled with Dr Katrinka in 05/2023. I informed pt to keep a BP log at home and send Mychart message with reading to send to Dr Katrinka. Pt expressed clear understanding

## 2023-03-09 ENCOUNTER — Encounter: Payer: Self-pay | Admitting: Family Medicine

## 2023-03-09 NOTE — Telephone Encounter (Signed)
 FYI

## 2023-03-10 ENCOUNTER — Encounter: Payer: Self-pay | Admitting: Family Medicine

## 2023-03-10 ENCOUNTER — Ambulatory Visit: Payer: 59 | Admitting: Family Medicine

## 2023-03-10 VITALS — BP 130/94 | HR 70 | Temp 97.5°F | Resp 18 | Ht 72.0 in | Wt 248.4 lb

## 2023-03-10 DIAGNOSIS — R5383 Other fatigue: Secondary | ICD-10-CM

## 2023-03-10 DIAGNOSIS — I10 Essential (primary) hypertension: Secondary | ICD-10-CM

## 2023-03-10 DIAGNOSIS — R7303 Prediabetes: Secondary | ICD-10-CM

## 2023-03-10 LAB — CBC WITH DIFFERENTIAL/PLATELET
Basophils Absolute: 0.1 10*3/uL (ref 0.0–0.1)
Basophils Relative: 1.2 % (ref 0.0–3.0)
Eosinophils Absolute: 0.4 10*3/uL (ref 0.0–0.7)
Eosinophils Relative: 5.8 % — ABNORMAL HIGH (ref 0.0–5.0)
HCT: 42.3 % (ref 39.0–52.0)
Hemoglobin: 14.3 g/dL (ref 13.0–17.0)
Lymphocytes Relative: 27.9 % (ref 12.0–46.0)
Lymphs Abs: 1.8 10*3/uL (ref 0.7–4.0)
MCHC: 33.8 g/dL (ref 30.0–36.0)
MCV: 89.3 fL (ref 78.0–100.0)
Monocytes Absolute: 0.8 10*3/uL (ref 0.1–1.0)
Monocytes Relative: 12.2 % — ABNORMAL HIGH (ref 3.0–12.0)
Neutro Abs: 3.4 10*3/uL (ref 1.4–7.7)
Neutrophils Relative %: 52.9 % (ref 43.0–77.0)
Platelets: 191 10*3/uL (ref 150.0–400.0)
RBC: 4.74 Mil/uL (ref 4.22–5.81)
RDW: 13.3 % (ref 11.5–15.5)
WBC: 6.5 10*3/uL (ref 4.0–10.5)

## 2023-03-10 LAB — COMPREHENSIVE METABOLIC PANEL
ALT: 37 U/L (ref 0–53)
AST: 25 U/L (ref 0–37)
Albumin: 4.6 g/dL (ref 3.5–5.2)
Alkaline Phosphatase: 64 U/L (ref 39–117)
BUN: 13 mg/dL (ref 6–23)
CO2: 28 meq/L (ref 19–32)
Calcium: 9.1 mg/dL (ref 8.4–10.5)
Chloride: 106 meq/L (ref 96–112)
Creatinine, Ser: 1 mg/dL (ref 0.40–1.50)
GFR: 87.63 mL/min (ref >60.00)
Glucose, Bld: 100 mg/dL — ABNORMAL HIGH (ref 70–99)
Potassium: 4 meq/L (ref 3.5–5.1)
Sodium: 140 meq/L (ref 135–145)
Total Bilirubin: 0.4 mg/dL (ref 0.2–1.2)
Total Protein: 7.1 g/dL (ref 6.0–8.3)

## 2023-03-10 LAB — TSH: TSH: 1.24 u[IU]/mL (ref 0.35–5.50)

## 2023-03-10 LAB — HEMOGLOBIN A1C: Hgb A1c MFr Bld: 6 % (ref 4.6–6.5)

## 2023-03-10 MED ORDER — LOSARTAN POTASSIUM 25 MG PO TABS: 25.0000 mg | ORAL_TABLET | Freq: Every day | ORAL | 0 refills | Status: DC

## 2023-03-10 NOTE — Progress Notes (Signed)
 Subjective:     Patient ID: Caleb Clayton, male    DOB: [birth year], 53 y.o.   MRN: [id number]  Chief Complaint  Patient presents with   Headache    Off and on for a few months   Hypertension    Started last Wednesday after getting shingles vaccine   Fatigue    Off and on for a few months    Neck Pain    Off and on for a few months     HPI-pt aware using AI  HTN-on amlodipine  5mg  150/102 few days ago.  Checking at home 130-140's90's-100's.  Past few mo, some ha, neck pains, fatigue, some flushing at night(sweats).  Snoring more again.   Has gained wt back(had lost 40# 2 yrs ago, but has slowly gained back).  Went to [employer] 4 mo ago. Trying to not snack a lot  no cp/palp.  More DOE lately Fh dm and sugars higher in past.  Discussed the use of AI scribe software for clinical note transcription with the patient, who gave verbal consent to proceed.  History of Present Illness   The patient, with a history of hypertension managed with amlodipine  5mg  nightly, presents with concerns of elevated blood pressure readings and associated symptoms. They report that their blood pressure was measured at 150/120 during a recent visit for a shingles shot, and subsequent home monitoring over three days showed persistently high readings in the 140s/130s.  Over the past few months, the patient has experienced headaches, neck pain, and fatigue, which they now suspect may be related to their hypertension. They admit to not regularly monitoring their blood pressure prior to these symptoms. They also report nocturnal sweating and increased snoring, which they attribute to recent weight gain. They had previously lost approximately 40 pounds two years ago, but have since regained this weight.  The patient denies chest pain but reports recent episodes of shortness of breath, particularly with exertion. They work remotely and acknowledge a lack of regular physical activity. They also report feeling generally unwell  and fatigued, describing it as feeling tired a lot. They deny a diagnosis of sleep apnea but have not been tested for it.  The patient has been on amlodipine  for approximately two years and has not been on any other antihypertensive medications. They report that their blood pressure was well-controlled prior to their recent weight gain. They have a family history of diabetes and were previously in the prediabetic range, but their blood sugar levels improved with weight loss.       Health Maintenance Due  Topic Date Due   Colonoscopy  07/30/2022    Past Medical History:  Diagnosis Date   Bipolar disorder (HCC)    Depression    Heart murmur    History of skin cancer    Stutter     Past Surgical History:  Procedure Laterality Date   arm fx     HERNIA REPAIR     9 mo old   mole bx     WRIST FRACTURE SURGERY  1985, 1989     Current Outpatient Medications:    amLODipine  (NORVASC ) 5 MG tablet, TAKE 1 TABLET (5 MG TOTAL) BY MOUTH DAILY., Disp: 30 tablet, Rfl: 11   benztropine  (COGENTIN ) 0.5 MG tablet, Take 1 tablet (0.5 mg total) by mouth 2 (two) times daily., Disp: 62 tablet, Rfl: 11   clonazePAM  (KLONOPIN ) 1 MG tablet, TAKE 1 TABLET (1 MG TOTAL) BY MOUTH AT BEDTIME AS NEEDED (DO NOT  DRIVE FOR A MINIMUM OF 8 HOURS AFTER TAKING)., Disp: 30 tablet, Rfl: 5   lamoTRIgine  (LAMICTAL ) 25 MG tablet, Take 1 tablet (25 mg total) by mouth 2 (two) times daily., Disp: 62 tablet, Rfl: 11   losartan  (COZAAR ) 25 MG tablet, Take 1 tablet (25 mg total) by mouth daily., Disp: 30 tablet, Rfl: 0   sertraline  (ZOLOFT ) 100 MG tablet, Take 1 tablet (100 mg total) by mouth daily., Disp: 31 tablet, Rfl: 11  No Known Allergies ROS neg/noncontributory except as noted HPI/below      Objective:     BP (!) 130/94 (BP Location: Left Arm, Patient Position: Sitting, Cuff Size: Large)   Pulse 70   Temp (!) 97.5 F (36.4 C) (Temporal)   Resp 18   Ht 6' (1.829 m)   Wt 248 lb 6 oz (112.7 kg)   SpO2 99%    BMI 33.69 kg/m  Wt Readings from Last 3 Encounters:  03/10/23 248 lb 6 oz (112.7 kg)  02/01/23 245 lb 6.4 oz (111.3 kg)  09/30/22 232 lb (105.2 kg)    Physical Exam   Gen: WDWN NAD HEENT: NCAT, conjunctiva not injected, sclera nonicteric NECK:  supple, no thyromegaly, no nodes, no carotid bruits CARDIAC: RRR, S1S2+, no murmur. DP 2+B LUNGS: CTAB. No wheezes ABDOMEN:  BS+, soft, NTND, No HSM, no masses EXT:  no edema MSK: no gross abnormalities.  NEURO: A&O x3.  CN II-XII intact.  PSYCH: normal mood. Good eye contact.  Stutters  EKG: normal.  Sent for scanning as interface shut down before transmitted.     Assessment & Plan:  Essential hypertension -     CBC with Differential/Platelet -     Comprehensive metabolic panel -     Hemoglobin A1c -     TSH -     EKG 12-Lead -     Losartan  Potassium; Take 1 tablet (25 mg total) by mouth daily.  Dispense: 30 tablet; Refill: 0  Prediabetes -     Hemoglobin A1c  Other fatigue -     CBC with Differential/Platelet -     Comprehensive metabolic panel -     Hemoglobin A1c -     TSH -     EKG 12-Lead  Assessment and Plan    Hypertension Recent blood pressure readings of 150/102 and 140s/130s over 90s/100s indicate suboptimal control of hypertension, despite two years on amlodipine  5 mg nightly. They report headaches, neck pain, fatigue, and shortness of breath over the past month, likely exacerbated by weight gain and decreased physical activity. We discussed the risks of uncontrolled hypertension, including stroke and heart disease, and decided against increasing amlodipine  due to the risk of leg swelling. Instead, we will start losartan  25 mg daily while continuing amlodipine  5 mg nightly. They are advised to monitor their blood pressure at home and seek emergency care if symptoms worsen. We will order an EKG and basic blood work, with a follow-up in one week with Dr. Katrinka or another provider, and they should bring their blood  pressure cuff to the next visit.  Shortness of Breath They have experienced shortness of breath over the past month, primarily with walking, raising concerns of deconditioning, uncontrolled hypertension, or other underlying conditions. To rule out serious causes, we will order an EKG and basic blood work. They are advised to monitor symptoms and seek emergency care if symptoms worsen.  Weight Gain They have regained weight lost two years ago, leading to increased snoring and potentially impacting  blood pressure control. Previously successful with Weight [employer], they are not currently following a structured diet or exercise regimen. We discussed the benefits of weight loss on blood pressure and overall health and encouraged the resumption of a structured weight loss program, advising on diet and exercise.  General Health Maintenance Scheduled for a physical in March, they have not had a recent diabetes screening despite a family history and previous prediabetes status. We emphasized the importance of regular screenings and follow-up, planning to perform a diabetes screening during the physical in March and encouraging maintaining regular follow-up appointments.        Return in about 1 week (around 03/17/2023) for HTN-Dr Katrinka if possiple but me if not.  Jenkins CHRISTELLA Carrel, MD

## 2023-03-10 NOTE — Patient Instructions (Signed)
 It was very nice to see you today!  Start Losartan  25mg  daily and continue to amlodipine . Monitor blood pressure and bring cuff to next visit  New symptoms, persistant chest pain-to ER   PLEASE NOTE:  If you had any lab tests please let us  know if you have not heard back within a few days. You may see your results on MyChart before we have a chance to review them but we will give you a call once they are reviewed by us . If we ordered any referrals today, please let us  know if you have not heard from their office within the next week.   Please try these tips to maintain a healthy lifestyle:  Eat most of your calories during the day when you are active. Eliminate processed foods including packaged sweets (pies, cakes, cookies), reduce intake of potatoes, white bread, white pasta, and white rice. Look for whole grain options, oat flour or almond flour.  Each meal should contain half fruits/vegetables, one quarter protein, and one quarter carbs (no bigger than a computer mouse).  Cut down on sweet beverages. This includes juice, soda, and sweet tea. Also watch fruit intake, though this is a healthier sweet option, it still contains natural sugar! Limit to 3 servings daily.  Drink at least 1 glass of water with each meal and aim for at least 8 glasses per day  Exercise at least 150 minutes every week.

## 2023-03-12 NOTE — Progress Notes (Signed)
 Labs are normal except 1.  A1C(3 month average of sugars) is elevated.  This is considered PreDiabetes.  Work on diet-decrease sugars and starches and aim for 30 minutes of exercise 5 days/week to prevent progression to diabetes   Needs to schedule his 1 week follow up

## 2023-04-11 ENCOUNTER — Other Ambulatory Visit: Payer: Self-pay | Admitting: Family Medicine

## 2023-04-11 DIAGNOSIS — I10 Essential (primary) hypertension: Secondary | ICD-10-CM

## 2023-04-26 ENCOUNTER — Encounter: Payer: Self-pay | Admitting: Family Medicine

## 2023-04-27 ENCOUNTER — Other Ambulatory Visit: Payer: Self-pay | Admitting: Family Medicine

## 2023-04-27 DIAGNOSIS — F3176 Bipolar disorder, in full remission, most recent episode depressed: Secondary | ICD-10-CM

## 2023-05-03 ENCOUNTER — Ambulatory Visit (INDEPENDENT_AMBULATORY_CARE_PROVIDER_SITE_OTHER): Payer: 59 | Admitting: Family Medicine

## 2023-05-03 ENCOUNTER — Encounter: Payer: Self-pay | Admitting: Family Medicine

## 2023-05-03 VITALS — BP 140/84 | HR 69 | Temp 98.2°F | Ht 72.0 in | Wt 246.0 lb

## 2023-05-03 DIAGNOSIS — R0683 Snoring: Secondary | ICD-10-CM

## 2023-05-03 DIAGNOSIS — E785 Hyperlipidemia, unspecified: Secondary | ICD-10-CM | POA: Diagnosis not present

## 2023-05-03 DIAGNOSIS — Z0000 Encounter for general adult medical examination without abnormal findings: Secondary | ICD-10-CM

## 2023-05-03 DIAGNOSIS — R7303 Prediabetes: Secondary | ICD-10-CM

## 2023-05-03 DIAGNOSIS — I10 Essential (primary) hypertension: Secondary | ICD-10-CM

## 2023-05-03 NOTE — Progress Notes (Signed)
 Phone: [phone]   Subjective:  Patient presents today for their annual physical. Chief complaint-noted.   See problem oriented charting- ROS- full  review of systems was completed and negative  except for: allergies as starting into spring, some fatigue in mornings and can feel sweaty at night- no night sweats, neck ain and stiffness- wonders if related to looking at screens 11 hours a day- offered referral to physical therapy but declines- also gets headache(s) possibly related to this, left joint pain, snoring- also gets headaches as noted- could be related. Sometimes feels winded with yard work- no chest pain - feels could be weight related  The following were reviewed and entered/updated in epic: Past Medical History:  Diagnosis Date   Anxiety    Bipolar disorder (HCC)    Depression    Heart murmur    History of skin cancer    Hypertension    Stutter    Patient Active Problem List   Diagnosis Date Noted   Bipolar disorder (HCC) 01/13/2014    Priority: High   STUTTERING 08/24/2009    Priority: High   Essential hypertension 09/04/2020    Priority: Medium    Hyperlipidemia, unspecified 06/18/2019    Priority: Medium    Snoring 05/08/2007    Priority: Low   SKIN CANCER, HX OF 11/20/2006    Priority: Low   Goiter 11/16/2021   Past Surgical History:  Procedure Laterality Date   arm fx     HERNIA REPAIR     9 mo old   mole bx     WRIST FRACTURE SURGERY  1985, 52    Family History  Problem Relation Age of Onset   Hyperlipidemia Mother        father   Hypertension Mother        father   Colon polyps Mother    Stomach cancer Mother    Depression Father    Melanoma Father    Lung cancer Father    Colon polyps Father    Prostate cancer Paternal Grandfather        late in life   Bipolar disorder Neg Hx    Colon cancer Neg Hx    Esophageal cancer Neg Hx    Rectal cancer Neg Hx     Medications- reviewed and updated Current Outpatient Medications   Medication Sig Dispense Refill   amLODipine (NORVASC) 5 MG tablet TAKE 1 TABLET (5 MG TOTAL) BY MOUTH DAILY. 30 tablet 11   benztropine (COGENTIN) 0.5 MG tablet TAKE 1 TABLET BY MOUTH 2 TIMES DAILY. 62 tablet 11   lamoTRIgine (LAMICTAL) 25 MG tablet Take 1 tablet (25 mg total) by mouth 2 (two) times daily. 62 tablet 11   losartan (COZAAR) 25 MG tablet TAKE 1 TABLET (25 MG TOTAL) BY MOUTH DAILY. 90 tablet 1   sertraline (ZOLOFT) 100 MG tablet TAKE 1 TABLET BY MOUTH EVERY DAY 31 tablet 11   clonazePAM (KLONOPIN) 1 MG tablet TAKE 1 TABLET (1 MG TOTAL) BY MOUTH AT BEDTIME AS NEEDED (DO NOT DRIVE FOR A MINIMUM OF 8 HOURS AFTER TAKING). (Patient not taking: Reported on 05/03/2023) 30 tablet 5   No current facility-administered medications for this visit.    Allergies-reviewed and updated No Known Allergies  Social History   Social History Narrative   Married. 2 kids 16 and 18 (app state planned)  in 2025- [employer]  (live close).       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       Hobbies: yardwork, basketball   Objective  Objective:  BP (!) 140/84   Pulse 69   Temp 98.2 F (36.8 C)   Ht 6' (1.829 m)   Wt 246 lb (111.6 kg)   SpO2 96%   BMI 33.36 kg/m  Gen: NAD, resting comfortably HEENT: Mucous membranes are moist. Oropharynx normal Neck: no thyromegaly CV: RRR no murmurs rubs or gallops Lungs: CTAB no crackles, wheeze, rhonchi Abdomen: soft/nontender/nondistended/normal bowel sounds. No rebound or guarding.  Ext: no edema Skin: warm, dry Neuro: grossly normal, moves all extremities, PERRLA opts out rectal or genitourinary exam    Assessment and Plan  53 y.o. male presenting for annual physical.  Health Maintenance counseling: 1. Anticipatory guidance: Patient counseled regarding regular dental exams -q6 months, eye exams  -yearly,  avoiding smoking and second hand smoke , limiting alcohol to 2 beverages per day - 1 in last year, no illicit drugs .   2. Risk factor reduction:  Advised patient of need for regular exercise and diet rich and fruits and vegetables to reduce risk of heart attack and stroke.  Exercise- some walking but inconsistent.  Diet/weight management-up 15 pounds in the last year and had been as low as 223 in 2022 with [place] weight loss but this is costly and reports actually as low as 195 on home scales Wt Readings from Last 3 Encounters:  05/03/23 246 lb (111.6 kg)  03/10/23 248 lb 6 oz (112.7 kg)  02/01/23 245 lb 6.4 oz (111.3 kg)  3. Immunizations/screenings/ancillary studies-declines COVID vaccination (plus had COVID in fall)  but otherwise up-to-date  Immunization History  Administered Date(s) Administered   Influenza Split 12/05/2019   Influenza-Unspecified 12/07/2013, 12/01/2016, 12/06/2018, 11/30/2020, 12/01/2021, 11/30/2022   PFIZER(Purple Top)SARS-COV-2 Vaccination 03/13/2019, 04/03/2019, 11/29/2019   Pfizer Covid-19 Vaccine Bivalent Booster 33yrs & up 12/21/2020   Td 11/20/2006   Tdap 06/18/2019   Zoster Recombinant(Shingrix) 01/05/2023, 03/08/2023   4. Prostate cancer screening-  no family history early-paternal grandfather with prostate cancer later in life, start at age 36  5. Colon cancer screening - scheduled for colonoscopy in may after tubular adenomas in 2021 6. Skin cancer screening-follows with dermatology yearly with father with history of melanoma. advised regular sunscreen use. Denies worrisome, changing, or new skin lesions.  7. Smoking associated screening (lung cancer screening, AAA screen 65-75, UA)-never smoker- his father did pass in September 2020 of lung cancer but was a  heavy smoker  8. STD screening -monogamous  Status of chronic or acute concerns   # Bipolar disorder S:Compliant with  Cogentin 0.5 mg twice daily (tremor and neck stiffness),  sertraline 100 mg, Lamictal 25 mg twice a day through psychiatry in the past but he stopped taking his insurance so we started prescribing.   -prn clonazepam at bedtime for sleep -latuda in past but psychiatry stopped    04/11/2022    3:49 PM 11/03/2020   11:16 AM 07/30/2020    1:37 PM  Depression screen PHQ 2/9  Decreased Interest 0 0 0  Down, Depressed, Hopeless 1  0  PHQ - 2 Score 1 0 0  Altered sleeping 1  1  Tired, decreased energy 1  3  Change in appetite 2  0  Feeling bad or failure about yourself  0  0  Trouble concentrating 0  0  Moving slowly or fidgety/restless  0  0  Suicidal thoughts 0  0  PHQ-9 Score 5  4  Difficult doing work/chores Not difficult at all  Not difficult at all  A/P: full remission- no hypomania and dno depressed mood- continue current medications    #hyperlipidemia S: Not currently on medication  Lab Results  Component Value Date   CHOL 157 11/11/2021   HDL 49.20 11/11/2021   LDLCALC 92 11/11/2021   TRIG 79.0 11/11/2021   CHOLHDL 3 11/11/2021  A/P: very mild elevations but not in range for medicines- offered to update levels today- but wants to work on weight loss plus would be unlikely to start with 10 year arteriosclerotic cardiovascular disease risk under 4% in addition to his prediabetes    #hypertension S: medication: amlodipine 5 mg, losartan 25 mg started 2025 Home readings #s: 140's/110s A/P: blood pressure above goal- desire <135/85- he wants to work on getting consistent exercise 30 minutes at least 5 days a week and work on weight loss and update me in 1-2 months -offered repeat BMP to assess possible renal artery stenosis and see if creatinine up 30% but he declines  # Hyperglycemia/insulin resistance/prediabetes- peak a1c 6.0 in 2025 S:  Medication: none A/P: prediabetes noted- wants to work on healthy eating and regular exercise   #Snoring- We have placed a referral for you today to [facility] for sleep apnea testing- please  call their # if you do not hear within a week (may be listed below or you may see mychart message within a few days with #).  Worse with weight gain  Recommended follow up: Return in about 6 months (around 11/03/2023) for followup or sooner if needed.Schedule b4 you leave. Future Appointments  Date Time Provider Department Center  05/30/2023  8:00 AM [facility] PRE[facility]  07/04/2023  7:00 AM Pyrtle, Carie Caddy, MD [facility]   Lab/Order associations: fasting   ICD-10-CM   1. Preventative health care  Z00.00     2. Essential hypertension  I10     3. Prediabetes  R73.03     4. Hyperlipidemia, unspecified hyperlipidemia type  E78.5     5. Snoring  R06.83 Ambulatory referral to Pulmonology      No orders of the defined types were placed in this encounter.   Return precautions advised.  Tana Conch, MD

## 2023-05-03 NOTE — Patient Instructions (Addendum)
 Health Maintenance Due  Topic Date Due   Colonoscopy  07/30/2022  Thanks for scheduling this !  We have placed a referral for you today to [facility] for sleep apnea testing- please call their # if you do not hear within a week (may be listed below or you may see mychart message within a few days with #).     blood pressure above goal- desire <135/85- he wants to work on getting consistent exercise 30 minutes at least 5 days a week and work on weight loss and update me in 1-2 months  I suggest myfitnesspal- commit to tracking every day for a month Use 0.5 pounds per week weight loss goal (or up to 1 pounds if needed) Set a reasonable goal such as 5-10 lbs and can reset goal once you reach the goal Do not connect your step counter to this- watch or phone Update me in 2-3 months with how you are doing  Recommended follow up: Return in about 6 months (around 11/03/2023) for followup or sooner if needed.Schedule b4 you leave.

## 2023-05-07 ENCOUNTER — Other Ambulatory Visit: Payer: Self-pay | Admitting: Family Medicine

## 2023-05-07 DIAGNOSIS — F3176 Bipolar disorder, in full remission, most recent episode depressed: Secondary | ICD-10-CM

## 2023-05-18 ENCOUNTER — Ambulatory Visit: Admitting: Adult Health

## 2023-05-18 ENCOUNTER — Encounter: Payer: Self-pay | Admitting: Adult Health

## 2023-05-18 VITALS — BP 134/80 | HR 65 | Ht 72.0 in | Wt 248.8 lb

## 2023-05-18 DIAGNOSIS — E66811 Obesity, class 1: Secondary | ICD-10-CM

## 2023-05-18 DIAGNOSIS — R0683 Snoring: Secondary | ICD-10-CM | POA: Diagnosis not present

## 2023-05-18 DIAGNOSIS — R400 Somnolence: Secondary | ICD-10-CM | POA: Diagnosis not present

## 2023-05-18 DIAGNOSIS — Z6833 Body mass index (BMI) 33.0-33.9, adult: Secondary | ICD-10-CM

## 2023-05-18 DIAGNOSIS — E669 Obesity, unspecified: Secondary | ICD-10-CM | POA: Diagnosis not present

## 2023-05-18 NOTE — Patient Instructions (Addendum)
 Set up for home sleep study  Work on healthy weight loss.  Do not drive if sleepy  Use caution with sedating medications.  Follow up in 6 weeks to discuss results and treatment plan .

## 2023-05-18 NOTE — Progress Notes (Signed)
 @Patient  ID: Caleb Clayton, male    DOB: 08-27-1970, 53 y.o.   MRN: [id number]  Chief Complaint  Patient presents with   Consult    Referring provider: Shelva Majestic, MD  HPI: Discussed the use of AI scribe software for clinical note transcription with the patient, who gave verbal consent to proceed.  History of Present Illness   Caleb Caleb "Caleb Clayton" is a 53 year old male who presents with possible sleep apnea. He was referred by Dr. Durene Cal for evaluation of possible sleep apnea.  He has been experiencing loud snoring for the past few years, as noted by his wife. He often feels tired upon waking, even after a full night's sleep, and experiences daytime sleepiness. He does not nap during the day.  He describes waking up at night feeling sweaty and hot, occurring one to two times per night.  he continues to feel fatigued during the day.  He does not nap.  Over the last two years, he has gained weight, despite previously losing 40 pounds at a weight loss program in [place]. His current weight is 248 pounds, with a BMI of 33.  He consumes a significant amount of caffeine, drinking about six diet sodas per day and some decaf coffee. He takes Klonopin most nights, primarily for anxiety and bipolar disorder, which also helps him relax and go to sleep.  Epworth score is 4 typically gets sleepy if he sits down to rest, watch TV and in the evening hours.  No symptoms suspicious for cataplexy or sleep paralysis.  His past medical history includes high blood pressure and prediabetes. He denies any major surgeries, high cholesterol, congestive heart failure, or stroke. He has never had a sleep study before.  He is married and works in Engineer, structural at [employer]. He does not smoke, drink alcohol, or use drugs. He used to be more active, playing tennis with his daughter, and is considering starting to walk at night.     Past Surgical History:  Procedure Laterality Date    arm fx     HERNIA REPAIR     31 mo old   mole bx     WRIST FRACTURE SURGERY  1985, 1989     TEST/EVENTS :   No Known Allergies  Immunization History  Administered Date(s) Administered   Influenza Split 12/05/2019   Influenza-Unspecified 12/07/2013, 12/01/2016, 12/06/2018, 11/30/2020, 12/01/2021, 11/30/2022   PFIZER(Purple Top)SARS-COV-2 Vaccination 03/13/2019, 04/03/2019, 11/29/2019   Pfizer Covid-19 Vaccine Bivalent Booster 64yrs & up 12/21/2020   Td 11/20/2006   Tdap 06/18/2019   Zoster Recombinant(Shingrix) 01/05/2023, 03/08/2023    Past Medical History:  Diagnosis Date   Anxiety    Bipolar disorder (HCC)    Depression    Heart murmur    History of skin cancer    Hypertension    Stutter     Tobacco History: Social History   Tobacco Use  Smoking Status Never  Smokeless Tobacco Never   Counseling given: Not Answered   Outpatient Medications Prior to Visit  Medication Sig Dispense Refill   amLODipine (NORVASC) 5 MG tablet TAKE 1 TABLET (5 MG TOTAL) BY MOUTH DAILY. 30 tablet 11   benztropine (COGENTIN) 0.5 MG tablet TAKE 1 TABLET BY MOUTH 2 TIMES DAILY. 62 tablet 11   clonazePAM (KLONOPIN) 1 MG tablet TAKE 1 TABLET (1 MG TOTAL) BY MOUTH AT BEDTIME AS NEEDED (DO NOT DRIVE FOR A MINIMUM OF 8 HOURS AFTER TAKING). 30 tablet 5  lamoTRIgine (LAMICTAL) 25 MG tablet TAKE 1 TABLET BY MOUTH TWICE A DAY 62 tablet 11   losartan (COZAAR) 25 MG tablet TAKE 1 TABLET (25 MG TOTAL) BY MOUTH DAILY. 90 tablet 1   sertraline (ZOLOFT) 100 MG tablet TAKE 1 TABLET BY MOUTH EVERY DAY 31 tablet 11   No facility-administered medications prior to visit.     Review of Systems:   Constitutional:   No  weight loss, night sweats,  Fevers, chills, +fatigue, or  lassitude.  HEENT:   No headaches,  Difficulty swallowing,  Tooth/dental problems, or  Sore throat,                No sneezing, itching, ear ache, nasal congestion, post nasal drip,   CV:  No chest pain,  Orthopnea, PND,  swelling in lower extremities, anasarca, dizziness, palpitations, syncope.   GI  No heartburn, indigestion, abdominal pain, nausea, vomiting, diarrhea, change in bowel habits, loss of appetite, bloody stools.   Resp: No shortness of breath with exertion or at rest.  No excess mucus, no productive cough,  No non-productive cough,  No coughing up of blood.  No change in color of mucus.  No wheezing.  No chest wall deformity  Skin: no rash or lesions.  GU: no dysuria, change in color of urine, no urgency or frequency.  No flank pain, no hematuria   MS:  No joint pain or swelling.  No decreased range of motion.  No back pain.    Physical Exam  BP 134/80 (BP Location: Left Arm, Patient Position: Sitting)   Pulse 65   Ht 6' (1.829 m)   Wt 248 lb 12.8 oz (112.9 kg)   SpO2 97%   BMI 33.74 kg/m   GEN: A/Ox3; pleasant , NAD, well nourished    HEENT:  /AT,  NOSE-clear, THROAT-clear, no lesions, no postnasal drip or exudate noted.  Class III MP airway  NECK:  Supple w/ fair ROM; no JVD; normal carotid impulses w/o bruits; no thyromegaly or nodules palpated; no lymphadenopathy.    RESP  Clear  P & A; w/o, wheezes/ rales/ or rhonchi. no accessory muscle use, no dullness to percussion  CARD:  RRR, no m/r/g, no peripheral edema, pulses intact, no cyanosis or clubbing.  GI:   Soft & nt; nml bowel sounds; no organomegaly or masses detected.   Musco: Warm bil, no deformities or joint swelling noted.   Neuro: alert, no focal deficits noted.    Skin: Warm, no lesions or rashes    Lab Results:  CBC    Component Value Date/Time   WBC 6.5 03/10/2023 1139   RBC 4.74 03/10/2023 1139   HGB 14.3 03/10/2023 1139   HCT 42.3 03/10/2023 1139   PLT 191.0 03/10/2023 1139   MCV 89.3 03/10/2023 1139   MCHC 33.8 03/10/2023 1139   RDW 13.3 03/10/2023 1139   LYMPHSABS 1.8 03/10/2023 1139   MONOABS 0.8 03/10/2023 1139   EOSABS 0.4 03/10/2023 1139   BASOSABS 0.1 03/10/2023 1139    BMET     Component Value Date/Time   NA 140 03/10/2023 1139   K 4.0 03/10/2023 1139   CL 106 03/10/2023 1139   CO2 28 03/10/2023 1139   GLUCOSE 100 (H) 03/10/2023 1139   BUN 13 03/10/2023 1139   CREATININE 1.00 03/10/2023 1139   CALCIUM 9.1 03/10/2023 1139   GFRNONAA 88.25 09/03/2009 0859    BNP No results found for: "BNP"  ProBNP No results found for: "PROBNP"  Imaging: No  results found.  Administration History     None           No data to display          No results found for: "NITRICOXIDE"      Assessment & Plan:  Assessment and Plan    Suspected Obstructive Sleep Apnea   He presents with symptoms indicative of obstructive sleep apnea, such as loud snoring, nocturnal awakenings with sweating, daytime fatigue, and unrefreshing sleep. His BMI is 33, suggesting obesity, a known risk factor. The clinical suspicion is high due to these symptoms and risk factors. Untreated sleep apnea can lead to cardiovascular and cerebrovascular issues, including hypertension, heart failure, atrial fibrillation, stroke, fatigue, poor concentration, and increased diabetes risk  Diagnosis is crucial to prevent these complications. Order a home sleep study through [facility] to confirm sleep apnea. Schedule a follow-up in six weeks to review results and discuss treatment options like CPAP, dental devices, or surgical options such as the Inspire device if confirmed.   - discussed how weight can impact sleep and risk for sleep disordered breathing - discussed options to assist with weight loss: combination of diet modification, cardiovascular and strength training exercises   - had an extensive discussion regarding the adverse health consequences related to untreated sleep disordered breathing - specifically discussed the risks for hypertension, coronary artery disease, cardiac dysrhythmias, cerebrovascular disease, and diabetes - lifestyle modification discussed   - discussed how  sleep disruption can increase risk of accidents, particularly when driving - safe driving practices were discussed     Hypertension   Continue on current regimen and follow-up with primary care  Prediabetes   He has prediabetes.  Continue on current regimen follow-up primary care  Obesity   His BMI of 33 indicates obesity, a risk factor for sleep apnea and comorbidities like hypertension and prediabetes. Weight management is essential. Encourage weight loss through lifestyle changes, including increased physical activity and dietary modifications.  Anxiety and Bipolar Disorder   Continue on current regimen and follow-up primary care    Rubye Oaks, NP 05/18/2023

## 2023-05-30 ENCOUNTER — Ambulatory Visit (AMBULATORY_SURGERY_CENTER): Payer: 59

## 2023-05-30 VITALS — Ht 72.0 in | Wt 246.0 lb

## 2023-05-30 DIAGNOSIS — Z860100 Personal history of colon polyps, unspecified: Secondary | ICD-10-CM

## 2023-05-30 MED ORDER — SUTAB 1479-225-188 MG PO TABS: ORAL_TABLET | ORAL | 0 refills | Status: DC

## 2023-05-30 NOTE — Progress Notes (Signed)
 No egg or soy allergy known to patient  No issues known to pt with past sedation with any surgeries or procedures Patient denies ever being told they had issues or difficulty with intubation  No FH of Malignant Hyperthermia Pt is not on diet pills or GLP-1 medications Pt is not on  home 02  Pt is not on blood thinners  Pt denies issues with chronic constipation  No A fib or A flutter Have any cardiac testing pending--no

## 2023-06-02 ENCOUNTER — Encounter

## 2023-06-02 DIAGNOSIS — R400 Somnolence: Secondary | ICD-10-CM

## 2023-06-02 DIAGNOSIS — R0683 Snoring: Secondary | ICD-10-CM

## 2023-06-19 ENCOUNTER — Telehealth: Payer: Self-pay | Admitting: Pulmonary Disease

## 2023-06-19 ENCOUNTER — Encounter: Payer: 59 | Admitting: Internal Medicine

## 2023-06-19 DIAGNOSIS — G4733 Obstructive sleep apnea (adult) (pediatric): Secondary | ICD-10-CM | POA: Diagnosis not present

## 2023-06-19 NOTE — Telephone Encounter (Signed)
 Call patient  Sleep study result  Date of study: 06/02/2023  Impression: Moderate obstructive sleep apnea with AHI of 24.7, mild oxygen desaturations  Recommendation: DME referral  Recommend CPAP therapy for moderate obstructive sleep apnea  Auto titrating CPAP with pressure settings of 5-15 will be appropriate  Encourage weight loss measures  Follow-up in the office 4 to 6 weeks following initiation of treatment

## 2023-06-19 NOTE — Telephone Encounter (Signed)
 Can we get in sooner to discuss results and tx plan , can double book in person or virtual

## 2023-06-22 NOTE — Telephone Encounter (Signed)
ATC x1 LVM for patient to call our office back regarding prior message.  

## 2023-06-26 ENCOUNTER — Ambulatory Visit: Admitting: Adult Health

## 2023-06-26 ENCOUNTER — Encounter: Payer: Self-pay | Admitting: Adult Health

## 2023-06-26 ENCOUNTER — Encounter: Payer: Self-pay | Admitting: Internal Medicine

## 2023-06-26 VITALS — BP 120/88 | HR 76 | Ht 72.0 in | Wt 245.2 lb

## 2023-06-26 DIAGNOSIS — G4733 Obstructive sleep apnea (adult) (pediatric): Secondary | ICD-10-CM | POA: Diagnosis not present

## 2023-06-26 NOTE — Assessment & Plan Note (Signed)
 Moderate obstructive sleep apnea-sleep study shows AHI at 24.7/hour, 29% central predominance and SpO2 low at 84%.  We discussed that he may have underlying complex sleep apnea with central events.  We discussed treatment options and consideration of CPAP therapy he may need a CPAP titration study.  Patient declines on his CPAP says that he absolutely cannot wear the CPAP.  Wants a referral to orthodontics for oral appliance.  We did discuss that oral appliance may not be adequate therapy.  Patient says he wants to try this first.  - discussed how weight can impact sleep and risk for sleep disordered breathing - discussed options to assist with weight loss: combination of diet modification, cardiovascular and strength training exercises   - had an extensive discussion regarding the adverse health consequences related to untreated sleep disordered breathing - specifically discussed the risks for hypertension, coronary artery disease, cardiac dysrhythmias, cerebrovascular disease, and diabetes - lifestyle modification discussed   - discussed how sleep disruption can increase risk of accidents, particularly when driving - safe driving practices were discussed   Plan  Patient Instructions  Refer to Orthodontics for oral appliance for sleep apnea. -Dr Ardell Koller  Work on healthy weight loss.  Do not drive if sleepy  Use caution with sedating medications.  Follow up in 6 months and As needed

## 2023-06-26 NOTE — Patient Instructions (Signed)
 Refer to Orthodontics for oral appliance for sleep apnea. -Dr Ardell Koller  Work on healthy weight loss.  Do not drive if sleepy  Use caution with sedating medications.  Follow up in 6 months and As needed

## 2023-06-26 NOTE — Progress Notes (Signed)
 @Patient  ID: Caleb Clayton, male    DOB: [birth year], 53 y.o.   MRN: [id number]  Chief Complaint  Patient presents with   Follow-up    Referring provider: Almira Jaeger, MD  HPI: 53 yo male seen for sleep consult March 2025 for snoring, restless sleep and daytime sleepiness Medical history significant for anxiety, depression, bipolar disorder  TEST/EVENTS :   06/26/2023 Follow up: OSA  Patient presents for a 1 month follow-up.  Patient was seen last visit for a sleep consult.  He was experiencing snoring, restless sleep and daytime sleepiness.  Patient was set up for a home sleep study that was done June 02, 2023 that showed moderate sleep apnea with AHI of 24.7/hour and SpO2 low at 84%.  Had 29% central predominance.  We discussed his sleep study results in detail.  We went over treatment options including weight loss, oral appliance and CPAP therapy.  We discussed proceeding with CPAP therapy however patient declines says that he absolutely cannot wear a CPAP machine.  Wants a referral to orthodontics for evaluation of oral appliance.    No Known Allergies  Immunization History  Administered Date(s) Administered   Influenza Split 12/05/2019   Influenza-Unspecified 12/07/2013, 12/01/2016, 12/06/2018, 11/30/2020, 12/01/2021, 11/30/2022   PFIZER(Purple Top)SARS-COV-2 Vaccination 03/13/2019, 04/03/2019, 11/29/2019   Pfizer Covid-19 Vaccine Bivalent Booster 71yrs & up 12/21/2020   Td 11/20/2006   Tdap 06/18/2019   Zoster Recombinant(Shingrix ) 01/05/2023, 03/08/2023    Past Medical History:  Diagnosis Date   Anxiety    Bipolar disorder (HCC)    Depression    Heart murmur    History of skin cancer    Hypertension    Stutter     Tobacco History: Social History   Tobacco Use  Smoking Status Never  Smokeless Tobacco Never   Counseling given: Not Answered   Outpatient Medications Prior to Visit  Medication Sig Dispense Refill   amLODipine  (NORVASC ) 5 MG tablet  TAKE 1 TABLET (5 MG TOTAL) BY MOUTH DAILY. 30 tablet 11   benztropine  (COGENTIN ) 0.5 MG tablet TAKE 1 TABLET BY MOUTH 2 TIMES DAILY. 62 tablet 11   clonazePAM  (KLONOPIN ) 1 MG tablet TAKE 1 TABLET (1 MG TOTAL) BY MOUTH AT BEDTIME AS NEEDED (DO NOT DRIVE FOR A MINIMUM OF 8 HOURS AFTER TAKING). 30 tablet 5   lamoTRIgine  (LAMICTAL ) 25 MG tablet TAKE 1 TABLET BY MOUTH TWICE A DAY 62 tablet 11   losartan  (COZAAR ) 25 MG tablet TAKE 1 TABLET (25 MG TOTAL) BY MOUTH DAILY. 90 tablet 1   sertraline  (ZOLOFT ) 100 MG tablet TAKE 1 TABLET BY MOUTH EVERY DAY 31 tablet 11   Sodium Sulfate-Mag Sulfate-KCl (SUTAB ) 1479-225-188 MG TABS Use as directed for colonoscopy. MANUFACTURER CODES!! BIN: [id number] PCN: CN GROUP: [id number] MEMBER ID: [id number];[employer] AS SECONDARY INSURANCE ;NO PRIOR AUTHORIZATION 24 tablet 0   No facility-administered medications prior to visit.     Review of Systems:   Constitutional:   No  weight loss, night sweats,  Fevers, chills, +fatigue, or  lassitude.  HEENT:   No headaches,  Difficulty swallowing,  Tooth/dental problems, or  Sore throat,                No sneezing, itching, ear ache, nasal congestion, post nasal drip,   CV:  No chest pain,  Orthopnea, PND, swelling in lower extremities, anasarca, dizziness, palpitations, syncope.   GI  No heartburn, indigestion, abdominal pain, nausea, vomiting, diarrhea, change in bowel habits, loss of appetite, bloody stools.  Resp: No shortness of breath with exertion or at rest.  No excess mucus, no productive cough,  No non-productive cough,  No coughing up of blood.  No change in color of mucus.  No wheezing.  No chest wall deformity  Skin: no rash or lesions.  GU: no dysuria, change in color of urine, no urgency or frequency.  No flank pain, no hematuria   MS:  No joint pain or swelling.  No decreased range of motion.  No back pain.    Physical Exam  BP 120/88 (BP Location: Left Arm, Patient Position: Sitting, Cuff Size: Normal)    Pulse 76   Ht 6' (1.829 m)   Wt 245 lb 3.2 oz (111.2 kg)   SpO2 99%   BMI 33.26 kg/m   GEN: A/Ox3; pleasant , NAD, well nourished    HEENT:  [place]/AT,   NOSE-clear, THROAT-clear, no lesions, no postnasal drip or exudate noted.   NECK:  Supple w/ fair ROM; no JVD; normal carotid impulses w/o bruits; no thyromegaly or nodules palpated; no lymphadenopathy.    RESP  Clear  P & A; w/o, wheezes/ rales/ or rhonchi. no accessory muscle use, no dullness to percussion  CARD:  RRR, no m/r/g, no peripheral edema, pulses intact, no cyanosis or clubbing.  GI:   Soft & nt; nml bowel sounds; no organomegaly or masses detected.   Musco: Warm bil, no deformities or joint swelling noted.   Neuro: alert, no focal deficits noted.    Skin: Warm, no lesions or rashes    Lab Results:  CBC    Component Value Date/Time   WBC 6.5 03/10/2023 1139   RBC 4.74 03/10/2023 1139   HGB 14.3 03/10/2023 1139   HCT 42.3 03/10/2023 1139   PLT 191.0 03/10/2023 1139   MCV 89.3 03/10/2023 1139   MCHC 33.8 03/10/2023 1139   RDW 13.3 03/10/2023 1139   LYMPHSABS 1.8 03/10/2023 1139   MONOABS 0.8 03/10/2023 1139   EOSABS 0.4 03/10/2023 1139   BASOSABS 0.1 03/10/2023 1139    BMET    Component Value Date/Time   NA 140 03/10/2023 1139   K 4.0 03/10/2023 1139   CL 106 03/10/2023 1139   CO2 28 03/10/2023 1139   GLUCOSE 100 (H) 03/10/2023 1139   BUN 13 03/10/2023 1139   CREATININE 1.00 03/10/2023 1139   CALCIUM 9.1 03/10/2023 1139   GFRNONAA 88.25 09/03/2009 0859    BNP No results found for: "BNP"  ProBNP No results found for: "PROBNP"  Imaging: No results found.  Administration History     None           No data to display          No results found for: "NITRICOXIDE"      Assessment & Plan:   OSA (obstructive sleep apnea) Moderate obstructive sleep apnea-sleep study shows AHI at 24.7/hour, 29% central predominance and SpO2 low at 84%.  We discussed that he may have underlying  complex sleep apnea with central events.  We discussed treatment options and consideration of CPAP therapy he may need a CPAP titration study.  Patient declines on his CPAP says that he absolutely cannot wear the CPAP.  Wants a referral to orthodontics for oral appliance.  We did discuss that oral appliance may not be adequate therapy.  Patient says he wants to try this first.  - discussed how weight can impact sleep and risk for sleep disordered breathing - discussed options to assist with weight loss: combination  of diet modification, cardiovascular and strength training exercises   - had an extensive discussion regarding the adverse health consequences related to untreated sleep disordered breathing - specifically discussed the risks for hypertension, coronary artery disease, cardiac dysrhythmias, cerebrovascular disease, and diabetes - lifestyle modification discussed   - discussed how sleep disruption can increase risk of accidents, particularly when driving - safe driving practices were discussed   Plan  Patient Instructions  Refer to Orthodontics for oral appliance for sleep apnea. -Dr Ardell Koller  Work on healthy weight loss.  Do not drive if sleepy  Use caution with sedating medications.  Follow up in 6 months and As needed         Roena Clark, NP 06/26/2023

## 2023-06-27 NOTE — Telephone Encounter (Signed)
 Called and spoke with patient, he states he saw Tammy yesterday, 06/26/2023.  There was a long line, so he has not scheduled his 6 month follow up with her.  I advised him I would make a note in his chart and put him in for a recall and he will get a letter in the mail reminding him to call to schedule that follow up.  He verbalized understanding.  Nothing further needed.

## 2023-07-04 ENCOUNTER — Ambulatory Visit (AMBULATORY_SURGERY_CENTER): Payer: 59 | Admitting: Internal Medicine

## 2023-07-04 ENCOUNTER — Ambulatory Visit: Admitting: Adult Health

## 2023-07-04 ENCOUNTER — Encounter: Payer: Self-pay | Admitting: Internal Medicine

## 2023-07-04 VITALS — BP 115/73 | HR 57 | Temp 98.2°F | Resp 15 | Ht 72.0 in | Wt 246.0 lb

## 2023-07-04 DIAGNOSIS — K621 Rectal polyp: Secondary | ICD-10-CM

## 2023-07-04 DIAGNOSIS — D123 Benign neoplasm of transverse colon: Secondary | ICD-10-CM

## 2023-07-04 DIAGNOSIS — D128 Benign neoplasm of rectum: Secondary | ICD-10-CM

## 2023-07-04 DIAGNOSIS — Z1211 Encounter for screening for malignant neoplasm of colon: Secondary | ICD-10-CM | POA: Diagnosis present

## 2023-07-04 DIAGNOSIS — Z860100 Personal history of colon polyps, unspecified: Secondary | ICD-10-CM

## 2023-07-04 MED ORDER — SODIUM CHLORIDE 0.9 % IV SOLN: 500.0000 mL | Freq: Once | INTRAVENOUS | Status: DC

## 2023-07-04 NOTE — Patient Instructions (Addendum)
 Resume previous diet Continue present medications No aspirin, ibuprofen, naproxen or other non-steroidal anti-inflammatory drugs for 2 weeks after procedure Await pathology results Repeat colonoscopy date to be determined after pathology results are reviewed  See handout for polyps YOU HAD AN ENDOSCOPIC PROCEDURE TODAY AT THE [facility]:   Refer to the procedure report that was given to you for any specific questions about what was found during the examination.  If the procedure report does not answer your questions, please call your gastroenterologist to clarify.  If you requested that your care partner not be given the details of your procedure findings, then the procedure report has been included in a sealed envelope for you to review at your convenience later.  YOU SHOULD EXPECT: Some feelings of bloating in the abdomen. Passage of more gas than usual.  Walking can help get rid of the air that was put into your GI tract during the procedure and reduce the bloating. If you had a lower endoscopy (such as a colonoscopy or flexible sigmoidoscopy) you may notice spotting of blood in your stool or on the toilet paper. If you underwent a bowel prep for your procedure, you may not have a normal bowel movement for a few days.  Please Note:  You might notice some irritation and congestion in your nose or some drainage.  This is from the oxygen used during your procedure.  There is no need for concern and it should clear up in a day or so.  SYMPTOMS TO REPORT IMMEDIATELY:  Following lower endoscopy (colonoscopy or flexible sigmoidoscopy):  Excessive amounts of blood in the stool  Significant tenderness or worsening of abdominal pains  Swelling of the abdomen that is new, acute  Fever of 100F or higher  For urgent or emergent issues, a gastroenterologist can be reached at any hour by calling [phone]. Do not use MyChart messaging for urgent concerns.   DIET:  We do recommend a  small meal at first, but then you may proceed to your regular diet.  Drink plenty of fluids but you should avoid alcoholic beverages for 24 hours.  ACTIVITY:  You should plan to take it easy for the rest of today and you should NOT DRIVE or use heavy machinery until tomorrow (because of the sedation medicines used during the test).    FOLLOW UP: Our staff will call the number listed on your records the next business day following your procedure.  We will call around 7:15- 8:00 am to check on you and address any questions or concerns that you may have regarding the information given to you following your procedure. If we do not reach you, we will leave a message.     If any biopsies were taken you will be contacted by phone or by letter within the next 1-3 weeks.  Please call us  at [phone] if you have not heard about the biopsies in 3 weeks.   SIGNATURES/CONFIDENTIALITY: You and/or your care partner have signed paperwork which will be entered into your electronic medical record.  These signatures attest to the fact that that the information above on your After Visit Summary has been reviewed and is understood.  Full responsibility of the confidentiality of this discharge information lies with you and/or your care-partner.

## 2023-07-04 NOTE — Progress Notes (Signed)
 Called to room to assist during endoscopic procedure.  Patient ID and intended procedure confirmed with present staff. Received instructions for my participation in the procedure from the performing physician.

## 2023-07-04 NOTE — Progress Notes (Signed)
 Vss nad trans to pacu

## 2023-07-04 NOTE — Op Note (Signed)
 [facility] Patient Name: Caleb Clayton Procedure Date: 07/04/2023 7:12 AM MRN: [id number] Endoscopist: Nannette Babe , MD, [id number] Age: 53 Referring MD:  Date of Birth: Aug 31, 1970 Gender: Male Account #: [id number] Procedure:                Colonoscopy Indications:              High risk colon cancer surveillance: Personal                            history of multiple adenomas, Last colonoscopy:                            June 2021 (4 TAs) Medicines:                Monitored Anesthesia Care Procedure:                Pre-Anesthesia Assessment:                           - Prior to the procedure, a History and Physical                            was performed, and patient medications and                            allergies were reviewed. The patient's tolerance of                            previous anesthesia was also reviewed. The risks                            and benefits of the procedure and the sedation                            options and risks were discussed with the patient.                            All questions were answered, and informed consent                            was obtained. Prior Anticoagulants: The patient has                            taken no anticoagulant or antiplatelet agents. ASA                            Grade Assessment: II - A patient with mild systemic                            disease. After reviewing the risks and benefits,                            the patient was deemed in satisfactory condition to  undergo the procedure.                           After obtaining informed consent, the colonoscope                            was passed under direct vision. Throughout the                            procedure, the patient's blood pressure, pulse, and                            oxygen saturations were monitored continuously. The                            Olympus Scope SN [id number] was introduced through  the                            anus and advanced to the cecum, identified by                            appendiceal orifice and ileocecal valve. The                            colonoscopy was performed without difficulty. The                            patient tolerated the procedure well. The quality                            of the bowel preparation was good. The ileocecal                            valve, appendiceal orifice, and rectum were                            photographed. Scope In: 8:04:59 AM Scope Out: 8:26:38 AM Scope Withdrawal Time: 0 hours 19 minutes 21 seconds  Total Procedure Duration: 0 hours 21 minutes 39 seconds  Findings:                 The digital rectal exam was normal.                           A 14 mm polyp was found in the transverse colon.                            The polyp was pedunculated. The polyp was removed                            with a hot snare. Resection and retrieval were                            complete.  A 3 mm polyp was found in the rectum. The polyp was                            sessile. The polyp was removed with a cold snare.                            Resection and retrieval were complete.                           The retroflexed view of the distal rectum and anal                            verge was normal and showed no anal or rectal                            abnormalities. Complications:            No immediate complications. Estimated Blood Loss:     Estimated blood loss: none. Impression:               - One 14 mm polyp in the transverse colon, removed                            with a hot snare. Resected and retrieved.                           - One 3 mm polyp in the rectum, removed with a cold                            snare. Resected and retrieved.                           - The distal rectum and anal verge are normal on                            retroflexion view. Recommendation:            - Patient has a contact number available for                            emergencies. The signs and symptoms of potential                            delayed complications were discussed with the                            patient. Return to normal activities tomorrow.                            Written discharge instructions were provided to the                            patient.                           -  Resume previous diet.                           - Continue present medications.                           - No aspirin, ibuprofen, naproxen, or other                            non-steroidal anti-inflammatory drugs for 2 weeks                            after polyp removal.                           - Await pathology results.                           - Repeat colonoscopy date to be determined after                            pending pathology results are reviewed for                            surveillance. Nannette Babe, MD 07/04/2023 8:30:24 AM This report has been signed electronically.

## 2023-07-04 NOTE — Progress Notes (Signed)
 GASTROENTEROLOGY PROCEDURE H&P NOTE   Primary Care Physician: Almira Jaeger, MD    Reason for Procedure:   Hx of adenomas  Plan:    colonoscopy  Patient is appropriate for endoscopic procedure(s) in the ambulatory ([facility]) setting.  The nature of the procedure, as well as the risks, benefits, and alternatives were carefully and thoroughly reviewed with the patient. Ample time for discussion and questions allowed. The patient understood, was satisfied, and agreed to proceed.     HPI: Caleb Clayton is a 53 y.o. male who presents for colonoscopy.  Medical history as below.  Tolerated the prep.  No recent chest pain or shortness of breath.  No abdominal pain today.  Past Medical History:  Diagnosis Date   Anxiety    Bipolar disorder (HCC)    Depression    Heart murmur    History of skin cancer    Hypertension    Stutter     Past Surgical History:  Procedure Laterality Date   arm fx     COLONOSCOPY     HERNIA REPAIR     11 mo old   mole bx     WRIST FRACTURE SURGERY  1985, 1989    Prior to Admission medications   Medication Sig Start Date End Date Taking? Authorizing Provider  amLODipine  (NORVASC ) 5 MG tablet TAKE 1 TABLET (5 MG TOTAL) BY MOUTH DAILY. 07/15/22  Yes Almira Jaeger, MD  benztropine  (COGENTIN ) 0.5 MG tablet TAKE 1 TABLET BY MOUTH 2 TIMES DAILY. 04/27/23  Yes Almira Jaeger, MD  clonazePAM  (KLONOPIN ) 1 MG tablet TAKE 1 TABLET (1 MG TOTAL) BY MOUTH AT BEDTIME AS NEEDED (DO NOT DRIVE FOR A MINIMUM OF 8 HOURS AFTER TAKING). 11/09/22  Yes Almira Jaeger, MD  lamoTRIgine  (LAMICTAL ) 25 MG tablet TAKE 1 TABLET BY MOUTH TWICE A DAY 05/08/23  Yes Almira Jaeger, MD  losartan  (COZAAR ) 25 MG tablet TAKE 1 TABLET (25 MG TOTAL) BY MOUTH DAILY. 04/12/23  Yes Almira Jaeger, MD  sertraline  (ZOLOFT ) 100 MG tablet TAKE 1 TABLET BY MOUTH EVERY DAY 04/27/23  Yes Almira Jaeger, MD  amoxicillin  (AMOXIL ) 500 MG capsule SMARTSIG:4 Capsule(s) By Mouth 06/26/23    [provider]  Sodium Sulfate-Mag Sulfate-KCl (SUTAB ) 1479-225-188 MG TABS Use as directed for colonoscopy. MANUFACTURER CODES!! BIN: [id number] PCN: CN GROUP: [id number] MEMBER ID: [id number];SWF AS SECONDARY INSURANCE ;NO PRIOR AUTHORIZATION Patient not taking: Reported on 07/04/2023 05/30/23   Arafat Cocuzza, Amber Bail, MD    Current Outpatient Medications  Medication Sig Dispense Refill   amLODipine  (NORVASC ) 5 MG tablet TAKE 1 TABLET (5 MG TOTAL) BY MOUTH DAILY. 30 tablet 11   benztropine  (COGENTIN ) 0.5 MG tablet TAKE 1 TABLET BY MOUTH 2 TIMES DAILY. 62 tablet 11   clonazePAM  (KLONOPIN ) 1 MG tablet TAKE 1 TABLET (1 MG TOTAL) BY MOUTH AT BEDTIME AS NEEDED (DO NOT DRIVE FOR A MINIMUM OF 8 HOURS AFTER TAKING). 30 tablet 5   lamoTRIgine  (LAMICTAL ) 25 MG tablet TAKE 1 TABLET BY MOUTH TWICE A DAY 62 tablet 11   losartan  (COZAAR ) 25 MG tablet TAKE 1 TABLET (25 MG TOTAL) BY MOUTH DAILY. 90 tablet 1   sertraline  (ZOLOFT ) 100 MG tablet TAKE 1 TABLET BY MOUTH EVERY DAY 31 tablet 11   amoxicillin  (AMOXIL ) 500 MG capsule SMARTSIG:4 Capsule(s) By Mouth     Sodium Sulfate-Mag Sulfate-KCl (SUTAB ) 1479-225-188 MG TABS Use as directed for colonoscopy. MANUFACTURER CODES!! Vernard Goldberg: [id number] PCN: CN GROUP: [id number] MEMBER ID:  [id number];MWN AS SECONDARY INSURANCE ;NO PRIOR AUTHORIZATION (Patient not taking: Reported on 07/04/2023) 24 tablet 0   Current Facility-Administered Medications  Medication Dose Route Frequency Provider Last Rate Last Admin   0.9 %  sodium chloride  infusion  500 mL Intravenous Once Jleigh Striplin, Amber Bail, MD        Allergies as of 07/04/2023   (No Known Allergies)    Family History  Problem Relation Age of Onset   Hyperlipidemia Mother        father   Hypertension Mother        father   Colon polyps Mother    Stomach cancer Mother    Depression Father    Melanoma Father    Lung cancer Father    Colon polyps Father    Prostate cancer Paternal Grandfather        late in life   Bipolar disorder  Neg Hx    Colon cancer Neg Hx    Esophageal cancer Neg Hx    Rectal cancer Neg Hx     Social History   Socioeconomic History   Marital status: Married    Spouse name: Not on file   Number of children: Not on file   Years of education: Not on file   Highest education level: Bachelor's degree (e.g., BA, AB, BS)  Occupational History   Not on file  Tobacco Use   Smoking status: Never   Smokeless tobacco: Never  Vaping Use   Vaping status: Never Used  Substance and Sexual Activity   Alcohol use: No   Drug use: No   Sexual activity: Yes    Birth control/protection: None    Comment: My spouse has had a hysterectomy and no need for contrceptiv  Other Topics Concern   Not on file  Social History Narrative   Married. 2 kids 16 and 18 (app state planned)  in 2025- [employer]  (live close).       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       Hobbies: yardwork, basketball   Social Drivers of Corporate investment banker Strain: Low Risk  (03/10/2023)   Overall Financial Resource Strain (CARDIA)    Difficulty of Paying Living Expenses: Not very hard  Food Insecurity: No Food Insecurity (03/10/2023)   Hunger Vital Sign    Worried About Running Out of Food in the Last Year: Never true    Ran Out of Food in the Last Year: Never true  Transportation Needs: No Transportation Needs (03/10/2023)   PRAPARE - Administrator, Civil Service (Medical): No    Lack of Transportation (Non-Medical): No  Physical Activity: Inactive (03/10/2023)   Exercise Vital Sign    Days of Exercise per Week: 0 days    Minutes of Exercise per Session: 20 min  Stress: Stress Concern Present (03/10/2023)   [employer] of Occupational Health - Occupational Stress Questionnaire    Feeling of Stress : To some extent  Social Connections: Socially  Integrated (03/10/2023)   Social Connection and Isolation Panel [NHANES]    Frequency of Communication with Friends and Family: Twice a week    Frequency of Social Gatherings with Friends and Family: Once a week    Attends Religious Services: 1 to 4 times per year    Active Member of [employer] or Organizations: Yes  Attends Banker Meetings: 1 to 4 times per year    Marital Status: Married  Catering manager Violence: Not on file    Physical Exam: Vital signs in last 24 hours: @BP  (!) 157/104   Pulse 66   Temp 98.2 F (36.8 C) (Temporal)   Resp (!) 22   Ht 6' (1.829 m)   Wt 246 lb (111.6 kg)   SpO2 98%   BMI 33.36 kg/m  GEN: NAD EYE: Sclerae anicteric ENT: MMM CV: Non-tachycardic Pulm: CTA b/l GI: Soft, NT/ND NEURO:  Alert & Oriented x 3   Laurell Pond, MD [facility]  07/04/2023 8:03 AM

## 2023-07-04 NOTE — Progress Notes (Signed)
 Pt's states no medical or surgical changes since previsit or office visit.

## 2023-07-05 ENCOUNTER — Telehealth: Payer: Self-pay

## 2023-07-05 NOTE — Telephone Encounter (Signed)
 Attempted to reach patient for follow up phone call. No answer, left voicemail for patient to call Dr. Lovie Rudder office with any questions or concerns.

## 2023-07-06 LAB — SURGICAL PATHOLOGY

## 2023-07-10 ENCOUNTER — Encounter: Payer: Self-pay | Admitting: Internal Medicine

## 2023-07-20 ENCOUNTER — Ambulatory Visit: Admitting: Adult Health

## 2023-07-31 ENCOUNTER — Other Ambulatory Visit: Payer: Self-pay | Admitting: Family Medicine

## 2023-08-14 ENCOUNTER — Telehealth: Admitting: Family Medicine

## 2023-08-14 DIAGNOSIS — T753XXA Motion sickness, initial encounter: Secondary | ICD-10-CM | POA: Diagnosis not present

## 2023-08-14 DIAGNOSIS — R112 Nausea with vomiting, unspecified: Secondary | ICD-10-CM | POA: Diagnosis not present

## 2023-08-14 MED ORDER — SCOPOLAMINE 1 MG/3DAYS TD PT72: 1.0000 | MEDICATED_PATCH | TRANSDERMAL | 12 refills | Status: AC

## 2023-08-14 NOTE — Progress Notes (Signed)
E Visit for Motion Sickness  We are sorry that you are not feeling well. Here is how we plan to help!  Based on what you have shared with me it looks like you have symptoms of motion sickness.  I have prescribed a medication that will help prevent or alleviate your symptoms:  Scopolamine Transdermal 1 mg patch behind ear at least 4 hours prior to travel (preferably 12 hours)   Prevention:  You might feel better if you keep your eyes focused on outside while you are in motion. For example, if you are in a car, sit in the front and look in the direction you are moving; if you are on a boat, stay on the deck and look to the horizon. This helps make what you see match the movement you are feeling, and so you are less likely to feel sick.  You should also avoid reading, watching a movie, texting or reading messages, or looking at things close to you inside the vehicle you are riding in.  Use the seat head rest. Lean your head against the back of the seat or head rest when traveling in vehicles with seats to minimize head movements.  On a ship: When making your reservations, choose a cabin in the middle of the ship and near the waterline. When on board, go up on deck and focus on the horizon.  In an airplane: Request a window seat and look out the window. A seat over the front edge of the wing is the most preferable spot (the degree of motion is the lowest here). Direct the air vent to blow cool air on your face.  On a train: Always face forward and sit near a window.  In a vehicle: Sit in the front seat; if you are the passenger, look at the scenery in the distance. For some people, driving the vehicle (rather than being a passenger) is an instant remedy.  Avoid others who have become nauseous with motion sickness. Seeing and smelling others who have motion sickness may cause you to become sick.  GET HELP RIGHT AWAY IF:  Your symptoms do not improve or worsen within 2 days after  treatment.  You cannot keep down fluids after trying the medication.  Other associated symptoms such as severe headache, visual field changes, fever, or intractable nausea and vomiting.  MAKE SURE YOU:  Understand these instructions. Will watch your condition. Will get help right away if you are not doing well or get worse.  Thank you for choosing an e-visit.  Your e-visit answers were reviewed by a board certified advanced clinical practitioner to complete your personal care plan. Depending upon the condition, your plan could have included both over the counter or prescription medications.  Please review your pharmacy choice. Be sure that the pharmacy you have chosen is open so that you can pick up your prescription now.  If there is a problem you may message your provider in MyChart to have the prescription routed to another pharmacy.  Your safety is important to us. If you have drug allergies check your prescription carefully.   For the next 24 hours, you can use MyChart to ask questions about today's visit, request a non-urgent call back, or ask for a work or school excuse from your e-visit provider.  You will get an e-mail in the next two days asking about your experience. I hope that your e-visit has been valuable and will speed your recovery.   References or for more   information: https://wwwnc.cdc.gov/travel/yellowbook/2020/travel-by-air-land-sea/motion-sickness https://my.[facility].org/health/articles/12782-motion-sickness https://www.uptodate.com   I provided 5 minutes of non face-to-face time during this encounter for chart review, medication and order placement, as well as and documentation.   

## 2023-09-26 ENCOUNTER — Other Ambulatory Visit: Payer: Self-pay | Admitting: Family Medicine

## 2023-09-26 DIAGNOSIS — I10 Essential (primary) hypertension: Secondary | ICD-10-CM

## 2023-12-01 ENCOUNTER — Ambulatory Visit: Admitting: Family Medicine

## 2023-12-01 ENCOUNTER — Encounter: Payer: Self-pay | Admitting: Family Medicine

## 2023-12-01 ENCOUNTER — Ambulatory Visit

## 2023-12-01 ENCOUNTER — Other Ambulatory Visit: Payer: Self-pay | Admitting: Family Medicine

## 2023-12-01 VITALS — BP 112/78 | HR 71 | Temp 98.3°F | Ht 72.0 in | Wt 237.2 lb

## 2023-12-01 DIAGNOSIS — G4733 Obstructive sleep apnea (adult) (pediatric): Secondary | ICD-10-CM

## 2023-12-01 DIAGNOSIS — Z23 Encounter for immunization: Secondary | ICD-10-CM

## 2023-12-01 DIAGNOSIS — M542 Cervicalgia: Secondary | ICD-10-CM | POA: Diagnosis not present

## 2023-12-01 DIAGNOSIS — E669 Obesity, unspecified: Secondary | ICD-10-CM

## 2023-12-01 DIAGNOSIS — G8929 Other chronic pain: Secondary | ICD-10-CM | POA: Diagnosis not present

## 2023-12-01 DIAGNOSIS — I10 Essential (primary) hypertension: Secondary | ICD-10-CM

## 2023-12-01 DIAGNOSIS — Z6832 Body mass index (BMI) 32.0-32.9, adult: Secondary | ICD-10-CM

## 2023-12-01 MED ORDER — ZEPBOUND 2.5 MG/0.5ML ~~LOC~~ SOAJ: 2.5000 mg | SUBCUTANEOUS | 2 refills | Status: DC

## 2023-12-01 MED ORDER — MELOXICAM 15 MG PO TABS: 15.0000 mg | ORAL_TABLET | Freq: Every day | ORAL | 0 refills | Status: AC

## 2023-12-01 NOTE — Progress Notes (Addendum)
 Phone [phone] In person visit   Subjective:   JOVIAN LEMBCKE is a 53 y.o. year old very pleasant male patient who presents for/with See problem oriented charting Chief Complaint  Patient presents with   Neck Pain   Night Sweats   Weight Check   Sleep Apnea   Discussed the use of AI scribe software for clinical note transcription with the patient, who gave verbal consent to proceed.  History of Present Illness   Ellwyn Ergle Durwin Davisson is a 53 year old male with moderate obstructive sleep apnea who presents with neck pain, night sweats, and concerns about weight loss.  He has been experiencing neck pain for about a year, which has been worsening over time and is not relieved by stretching. The pain is located at the back of the neck and radiates into the upper shoulder, sparing the anterior shoulder. He spends significant time at the computer and has attempted ergonomic adjustments without relief. He has not undergone cervical imaging or recent physical therapy, and previous physical therapy did not provide relief. He has used muscle relaxants in the past but prefers to avoid them now due to current medication use.  He experiences intermittent night sweats, not occurring every night. His wife maintains the house temperature at 70 degrees year-round. He is unsure if the night sweats are related to his sleep apnea or another cause.  He has moderate obstructive sleep apnea and was recommended CPAP, which he does not tolerate. He was referred to orthodontics for an oral appliance, but the cost is prohibitive even with insurance assistance.  He has lost nine pounds since the last visit, which he did not realize, as his clothes feel snugger. He has been monitoring his diet and is aware of the need for caloric reduction and protein intake to prevent muscle mass loss. His father had a history of cancer, which returned with arm pain, and he passed away in December 04, 2023. This history contributes to  his concern about his own symptoms.     Addendum: please note patient has had documented failure of lifestyle changes through [place] weight loss without significant sustained weight loss  Past Medical History-  Patient Active Problem List   Diagnosis Date Noted   Bipolar disorder (HCC) 01/13/2014    Priority: High   STUTTERING 08/24/2009    Priority: High   Essential hypertension 09/04/2020    Priority: Medium    Hyperlipidemia, unspecified 06/18/2019    Priority: Medium    Snoring 05/08/2007    Priority: Low   SKIN CANCER, HX OF 11/20/2006    Priority: Low   OSA (obstructive sleep apnea) 06/26/2023   Goiter 11/16/2021    Medications- reviewed and updated Current Outpatient Medications  Medication Sig Dispense Refill   amLODipine  (NORVASC ) 5 MG tablet TAKE 1 TABLET (5 MG TOTAL) BY MOUTH DAILY. 30 tablet 11   benztropine  (COGENTIN ) 0.5 MG tablet TAKE 1 TABLET BY MOUTH 2 TIMES DAILY. 62 tablet 11   clonazePAM  (KLONOPIN ) 1 MG tablet TAKE 1 TABLET (1 MG TOTAL) BY MOUTH AT BEDTIME AS NEEDED (DO NOT DRIVE FOR A MINIMUM OF 8 HOURS AFTER TAKING). 30 tablet 5   lamoTRIgine  (LAMICTAL ) 25 MG tablet TAKE 1 TABLET BY MOUTH TWICE A DAY 62 tablet 11   losartan  (COZAAR ) 25 MG tablet TAKE 1 TABLET (25 MG TOTAL) BY MOUTH DAILY. 30 tablet 5   meloxicam (MOBIC) 15 MG tablet Take 1 tablet (15 mg total) by mouth daily. 10 tablet 0   scopolamine  (  TRANSDERM-SCOP) 1 MG/3DAYS Place 1 patch (1.5 mg total) onto the skin every 3 (three) days. 10 patch 12   sertraline  (ZOLOFT ) 100 MG tablet TAKE 1 TABLET BY MOUTH EVERY DAY 31 tablet 11   tirzepatide (ZEPBOUND) 2.5 MG/0.5ML Pen Inject 2.5 mg into the skin once a week. 2 mL 2   amoxicillin  (AMOXIL ) 500 MG capsule SMARTSIG:4 Capsule(s) By Mouth (Patient not taking: Reported on 12/01/2023)     No current facility-administered medications for this visit.     Objective:  BP 112/78 (BP Location: Left Arm, Patient Position: Sitting, Cuff Size: Normal)   Pulse  71   Temp 98.3 F (36.8 C) (Temporal)   Ht 6' (1.829 m)   Wt 237 lb 3.2 oz (107.6 kg)   SpO2 98%   BMI 32.17 kg/m  Gen: NAD, resting comfortably Stable thyromegaly without lymphadenopathy-also noted no lymphadenopathy under axilla or in the groin CV: RRR no murmurs rubs or gallops Lungs: CTAB no crackles, wheeze, rhonchi Abdomen: soft/nontender/nondistended/normal bowel sounds. Ext: no edema Skin: warm, dry Neuro: Baseline stutter noted    Assessment and Plan   # Chronic neck and upper shoulder pain Chronic neck and upper shoulder pain for about a year, worsening over time. Pain is located at the back of the neck and into the upper shoulder, not anterior. Pain worsens throughout the day, not upon waking. Previous physical therapy did not provide relief. No current use of muscle relaxants due to medication load. Considering potential arthritic changes. - Order cervical spine x-rays to establish a baseline. Concern cervical arthritis. Considered sports medicine or orthopedic consult but hed like to hold off. Also wanted to hold off on muscle relaxant- hasn't been too helpful in past - Prescribe meloxicam for 7-10 days to address potential arthritic changes, with caution due to increased stomach bleeding risk when combined with sertraline . - Advise to monitor for dark black stool or blood in the stool and report immediately if these occur.  #Obstructive sleep apnea Moderate obstructive sleep apnea. CPAP not tolerated due to mask issues. Oral appliance considered but cost-prohibitive. Exploring medication options for management. Discussed potential use of Zepbound for sleep apnea, acknowledging insurance coverage challenges. Discussed side effects including kidney injury, gallbladder disease, pancreatitis, and gastrointestinal symptoms. Advised to stay hydrated and monitor for symptoms. Discussed association with medullary thyroid  carcinoma along with other potential side effects and ensured  no family history of thyroid  cancer or MEN2 syndrome. - Submit request for Zepbound 2.5 mg under moderate obstructive sleep apnea for insurance coverage. - Start on the lowest dose at 2.5 mg, with potential to increase based on response and insurance approval. - Advise to monitor for low blood sugar symptoms and ensure adequate food intake if symptoms occur.  # Night sweats Intermittent night sweats, not occurring every night. Discussed concerns about possible serious underlying conditions like cancer, but no immediate action taken as no abnormal fatigue or fevers to suggest B symptoms and no lymphadenopathy noted. - Monitor night sweats and report if he becomes more frequent or severe. - Consider further evaluation with blood work if night sweats progress.  We jointly opted to hold off on blood work for now -We did not specifically discussed this but sertraline  can be associated with night sweats -Does have known goiter so we will need to monitor TSH at upcoming labs at physical  # Obesity but with recent weight loss due to lifestyle changes Recent weight loss of 9 pounds since last visit, possibly due to dietary monitoring and  lifestyle changes. Clothes feel snugger, possibly indicating muscle mass gain. Encouraged to continue caloric reduction and protein intake to prevent muscle mass loss. - Continue monitoring dietary intake and caloric reduction. - Encourage strength training to prevent muscle mass loss. - Monitor weight loss progress and adjust Cozaar  ([place]) dosage if weight loss stagnates after a month.       #hypertension S: medication: amlodipine  5 mg, losartan  25 mg started 2025  BP Readings from Last 3 Encounters:  12/01/23 112/78  07/04/23 115/73  06/26/23 120/88  A/P: Blood pressure very well-controlled-we discussed as he works on weight loss needs to monitor blood pressure as sometimes blood pressure medications need to be adjusted  # Hyperglycemia/insulin  resistance/prediabetes- peak a1c 6.0 in 2025 S:  Medication: None but hoping for Zepbound Exercise and diet-has worked on consolidated [place] and exercise Lab Results  Component Value Date   HGBA1C 6.0 03/10/2023  A/P: Prediabetes noted-that that would be beneficial for this as well-encouraged him to schedule a physical and we will recheck this at next labs  Recommended follow up: Return in about 6 months (around 05/31/2024) for physical or sooner if needed.Schedule b4 you leave.   Lab/Order associations:   ICD-10-CM   1. Immunization due  Z23 Flu vaccine trivalent PF, 6mos and older(Flulaval,Afluria,Fluarix,Fluzone)    Pneumococcal conjugate vaccine 20-valent (Prevnar 20)    2. Neck pain  M54.2 DG Cervical Spine Complete    3. OSA (obstructive sleep apnea)  G47.33 tirzepatide (ZEPBOUND) 2.5 MG/0.5ML Pen      Meds ordered this encounter  Medications   meloxicam (MOBIC) 15 MG tablet    Sig: Take 1 tablet (15 mg total) by mouth daily.    Dispense:  10 tablet    Refill:  0   tirzepatide (ZEPBOUND) 2.5 MG/0.5ML Pen    Sig: Inject 2.5 mg into the skin once a week.    Dispense:  2 mL    Refill:  2    G47.33 OSA moderate    Return precautions advised.  Garnette Lukes, MD

## 2023-12-01 NOTE — Patient Instructions (Signed)
 VISIT SUMMARY: During your visit, we discussed your ongoing neck pain, night sweats, sleep apnea, and recent weight loss with desire for more! We have outlined a plan to address each of these concerns.  YOUR PLAN: CHRONIC NECK AND UPPER SHOULDER PAIN on right: You have been experiencing worsening neck and upper shoulder pain for about a year. -We will order cervical spine x-rays to establish a baseline. 7-10 days for results.  -You will take meloxicam for 7-10 days to address potential arthritic changes. Be cautious of increased stomach bleeding risk when combined with sertraline . -Monitor for dark black stool or blood in the stool and report immediately if these occur.  OBSTRUCTIVE SLEEP APNEA: You have moderate obstructive sleep apnea and have had difficulty tolerating CPAP therapy. -We will submit a request for Zepbound under moderate obstructive sleep apnea for insurance coverage. -Start on the lowest dose of zepbound at 2.5 mg, with potential to increase based on response and insurance approval. Usually if weight loss stagnates for a month we would go to 5 mg then 7.5 mg later- hoping insurance covers. We reviewed side effects   NIGHT SWEATS: You have been experiencing intermittent night sweats. -Monitor night sweats and report if they become more frequent or severe. If they do embark on updated bloodwork  Obesity WITH RECENT WEIGHT LOSS: You have lost 9 pounds since your last visit,due to dietary monitoring and lifestyle changes. Congrats! -Continue monitoring dietary intake and caloric reduction. -Encourage strength training to prevent muscle mass loss with Zepbound as well as adequate protein h  Contains text generated by [employer].   Recommended follow up: No follow-ups on file.

## 2023-12-02 ENCOUNTER — Encounter: Payer: Self-pay | Admitting: Family Medicine

## 2023-12-02 DIAGNOSIS — R61 Generalized hyperhidrosis: Secondary | ICD-10-CM

## 2023-12-04 NOTE — Telephone Encounter (Signed)
 Which orders would you like to be placed?

## 2023-12-05 ENCOUNTER — Other Ambulatory Visit (INDEPENDENT_AMBULATORY_CARE_PROVIDER_SITE_OTHER)

## 2023-12-05 ENCOUNTER — Ambulatory Visit: Payer: Self-pay | Admitting: Family Medicine

## 2023-12-05 DIAGNOSIS — R61 Generalized hyperhidrosis: Secondary | ICD-10-CM | POA: Diagnosis not present

## 2023-12-05 LAB — CBC WITH DIFFERENTIAL/PLATELET
Basophils Absolute: 0.1 K/uL (ref 0.0–0.1)
Basophils Relative: 1.1 % (ref 0.0–3.0)
Eosinophils Absolute: 0.3 K/uL (ref 0.0–0.7)
Eosinophils Relative: 4.3 % (ref 0.0–5.0)
HCT: 40.2 % (ref 39.0–52.0)
Hemoglobin: 13.6 g/dL (ref 13.0–17.0)
Lymphocytes Relative: 23.7 % (ref 12.0–46.0)
Lymphs Abs: 1.4 K/uL (ref 0.7–4.0)
MCHC: 33.8 g/dL (ref 30.0–36.0)
MCV: 87.1 fl (ref 78.0–100.0)
Monocytes Absolute: 0.5 K/uL (ref 0.1–1.0)
Monocytes Relative: 7.7 % (ref 3.0–12.0)
Neutro Abs: 3.8 K/uL (ref 1.4–7.7)
Neutrophils Relative %: 63.2 % (ref 43.0–77.0)
Platelets: 198 K/uL (ref 150.0–400.0)
RBC: 4.61 Mil/uL (ref 4.22–5.81)
RDW: 13.2 % (ref 11.5–15.5)
WBC: 6 K/uL (ref 4.0–10.5)

## 2023-12-05 LAB — URINALYSIS, ROUTINE W REFLEX MICROSCOPIC
Bilirubin Urine: NEGATIVE
Ketones, ur: NEGATIVE
Leukocytes,Ua: NEGATIVE
Nitrite: NEGATIVE
Specific Gravity, Urine: 1.025 (ref 1.000–1.030)
Total Protein, Urine: NEGATIVE
Urine Glucose: NEGATIVE
Urobilinogen, UA: 1 (ref 0.0–1.0)
pH: 6 (ref 5.0–8.0)

## 2023-12-05 LAB — COMPREHENSIVE METABOLIC PANEL WITH GFR
ALT: 28 U/L (ref 0–53)
AST: 40 U/L — ABNORMAL HIGH (ref 0–37)
Albumin: 4.5 g/dL (ref 3.5–5.2)
Alkaline Phosphatase: 61 U/L (ref 39–117)
BUN: 10 mg/dL (ref 6–23)
CO2: 28 meq/L (ref 19–32)
Calcium: 9.2 mg/dL (ref 8.4–10.5)
Chloride: 105 meq/L (ref 96–112)
Creatinine, Ser: 0.96 mg/dL (ref 0.40–1.50)
GFR: 91.55 mL/min (ref >60.00)
Glucose, Bld: 110 mg/dL — ABNORMAL HIGH (ref 70–99)
Potassium: 3.9 meq/L (ref 3.5–5.1)
Sodium: 141 meq/L (ref 135–145)
Total Bilirubin: 0.4 mg/dL (ref 0.2–1.2)
Total Protein: 7 g/dL (ref 6.0–8.3)

## 2023-12-05 LAB — C-REACTIVE PROTEIN: CRP: <0.5 mg/dL (ref 0.5–20.0)

## 2023-12-05 LAB — SEDIMENTATION RATE: Sed Rate: 8 mm/h (ref 0–20)

## 2023-12-05 LAB — TSH: TSH: 2 u[IU]/mL (ref 0.35–5.50)

## 2023-12-06 ENCOUNTER — Other Ambulatory Visit: Payer: Self-pay | Admitting: Family Medicine

## 2023-12-06 DIAGNOSIS — R3129 Other microscopic hematuria: Secondary | ICD-10-CM

## 2023-12-07 ENCOUNTER — Ambulatory Visit: Payer: Self-pay | Admitting: Family Medicine

## 2023-12-07 ENCOUNTER — Other Ambulatory Visit: Payer: Self-pay | Admitting: Family Medicine

## 2023-12-07 DIAGNOSIS — R3129 Other microscopic hematuria: Secondary | ICD-10-CM

## 2023-12-07 NOTE — Telephone Encounter (Signed)
 Okay to come in and redo sample?

## 2023-12-08 ENCOUNTER — Other Ambulatory Visit: Payer: Self-pay | Admitting: Family Medicine

## 2023-12-08 ENCOUNTER — Other Ambulatory Visit

## 2023-12-08 DIAGNOSIS — R3129 Other microscopic hematuria: Secondary | ICD-10-CM

## 2023-12-09 ENCOUNTER — Other Ambulatory Visit: Payer: Self-pay | Admitting: Family Medicine

## 2023-12-09 DIAGNOSIS — G4733 Obstructive sleep apnea (adult) (pediatric): Secondary | ICD-10-CM

## 2023-12-09 LAB — URINALYSIS, ROUTINE W REFLEX MICROSCOPIC
Bilirubin Urine: NEGATIVE
Glucose, UA: NEGATIVE
Hgb urine dipstick: NEGATIVE
Ketones, ur: NEGATIVE
Leukocytes,Ua: NEGATIVE
Nitrite: NEGATIVE
Protein, ur: NEGATIVE
Specific Gravity, Urine: 1.009 (ref 1.001–1.035)
pH: 7 (ref 5.0–8.0)

## 2023-12-11 ENCOUNTER — Telehealth: Payer: Self-pay

## 2023-12-11 ENCOUNTER — Other Ambulatory Visit (HOSPITAL_COMMUNITY): Payer: Self-pay

## 2023-12-11 ENCOUNTER — Ambulatory Visit: Payer: Self-pay | Admitting: Family Medicine

## 2023-12-11 NOTE — Telephone Encounter (Signed)
 Pharmacy Patient Advocate Encounter   Received notification from RX Request Messages that prior authorization for Zepbound 2.5MG /0.5ML pen-injectors is required/requested.   Insurance verification completed.   The patient is insured through [facility].   Per test claim: PA required; PA submitted to above mentioned insurance via Latent Key/confirmation #/EOC [id number] Status is pending

## 2023-12-12 NOTE — Telephone Encounter (Signed)
 Pharmacy Patient Advocate Encounter  Received notification from [employer] that Prior Authorization for Zepbound 2.5MG /0.5ML pen-injectors  has been DENIED.  Full denial letter will be uploaded to the media tab. See denial reason below.    PA #/Case ID/Reference #: [id number]

## 2024-01-05 ENCOUNTER — Other Ambulatory Visit (HOSPITAL_COMMUNITY): Payer: Self-pay

## 2024-01-10 ENCOUNTER — Telehealth: Payer: Self-pay

## 2024-01-10 ENCOUNTER — Other Ambulatory Visit (HOSPITAL_COMMUNITY): Payer: Self-pay

## 2024-01-10 NOTE — Telephone Encounter (Signed)
 Pharmacy Patient Advocate Encounter   Received notification from Physician's Office that prior authorization for Zepbound 2.5MG /0.5ML Auto-injectors is required/requested.   Insurance verification completed.   The patient is insured through [facility].   Per test claim: PA required; PA submitted to above mentioned insurance via Latent Key/confirmation #/EOC [facility] Status is pending

## 2024-01-12 NOTE — Telephone Encounter (Signed)
 Pharmacy Patient Advocate Encounter  Received notification from [facility] that Prior Authorization for Zepbound 2.5MG /0.5ML Auto-injectors has been CANCELLED due to duplicate request.    PA #/Case ID/Reference #: [id number]

## 2024-01-15 ENCOUNTER — Telehealth: Payer: Self-pay | Admitting: Pharmacist

## 2024-01-15 NOTE — Telephone Encounter (Signed)
 Appeal has been submitted. Will advise when response is received or follow up in 1 week. Please be advised that most companies may take 30 days to make a decision. Appeal letter and supporting documentation have been faxed to [phone] on 01/15/2024.  Thank you, Devere Pandy, PharmD Clinical Pharmacist  [place]  Direct Dial: [phone]

## 2024-01-15 NOTE — Telephone Encounter (Signed)
 From a prior note from him I have tried weight loss with [place] weight loss. This med is needed to help with my sleep apnea.   This is a structured weight loss program which he has failed

## 2024-01-15 NOTE — Telephone Encounter (Signed)
 The insurance denied Zepbound, stating they needed documentation that the patient has had at least one previous unsuccessful dietary effort to lose weight.  The only thing I can find in the chart note is the following documentation:Recent weight loss of 9 pounds since last visit, possibly due to dietary monitoring and lifestyle changes. Clothes feel snugger, possibly indicating muscle mass gain. Encouraged to continue caloric reduction and protein intake to prevent muscle mass loss.   Is there documentation to support the patient has had a failed attempt at weight loss so the strongest appeal can be submitted?   Thank you, Devere Pandy, PharmD Clinical Pharmacist  [place]  Direct Dial: [phone]

## 2024-01-15 NOTE — Telephone Encounter (Signed)
 Please see message and advise

## 2024-01-21 ENCOUNTER — Encounter: Payer: Self-pay | Admitting: Family Medicine

## 2024-01-21 DIAGNOSIS — G4733 Obstructive sleep apnea (adult) (pediatric): Secondary | ICD-10-CM

## 2024-01-24 ENCOUNTER — Other Ambulatory Visit (HOSPITAL_COMMUNITY): Payer: Self-pay

## 2024-01-26 ENCOUNTER — Encounter (INDEPENDENT_AMBULATORY_CARE_PROVIDER_SITE_OTHER): Payer: Self-pay

## 2024-01-29 ENCOUNTER — Other Ambulatory Visit (HOSPITAL_COMMUNITY): Payer: Self-pay

## 2024-02-15 ENCOUNTER — Other Ambulatory Visit (HOSPITAL_COMMUNITY): Payer: Self-pay

## 2024-02-15 NOTE — Telephone Encounter (Signed)
 Zepbound  appeal has been approved through 07/30/2024.  Thank you, Devere Pandy, PharmD Clinical Pharmacist  [place]  Direct Dial: [phone]

## 2024-02-26 ENCOUNTER — Encounter (INDEPENDENT_AMBULATORY_CARE_PROVIDER_SITE_OTHER): Payer: Self-pay | Admitting: Otolaryngology

## 2024-02-26 ENCOUNTER — Ambulatory Visit (INDEPENDENT_AMBULATORY_CARE_PROVIDER_SITE_OTHER): Admitting: Otolaryngology

## 2024-02-26 VITALS — BP 142/91 | HR 83 | Ht 72.0 in | Wt 240.0 lb

## 2024-02-26 DIAGNOSIS — Z789 Other specified health status: Secondary | ICD-10-CM | POA: Diagnosis not present

## 2024-02-26 DIAGNOSIS — G4733 Obstructive sleep apnea (adult) (pediatric): Secondary | ICD-10-CM | POA: Diagnosis not present

## 2024-02-26 NOTE — Progress Notes (Signed)
 Dear Dr. Katrinka, Here is my assessment for our mutual patient, Caleb Clayton. Thank you for allowing me the opportunity to care for your patient. Please do not hesitate to contact me should you have any other questions. Sincerely, Dr. Eldora Blanch  Otolaryngology Clinic Note Referring provider: Dr. Katrinka HPI:  Caleb Clayton is a 53 y.o. male kindly referred by Dr. Katrinka for evaluation of OSA  Initial visit (01/2024): Discussed the use of AI scribe software for clinical note transcription with the patient, who gave verbal consent to proceed.  History of Present Illness Caleb Clayton is a 53 year old male with moderate to severe obstructive who presents for evaluation of surgical treatment options due to intolerance of CPAP therapy.   For several years he has had progressively worsening snoring, frequent nocturnal awakenings, and wakes feeling overheated. He does not use insomnia medications or aids.  He has never used CPAP because of significant claustrophobia and anticipates poor tolerance. An oral appliance was recommended but expected to be ineffective and was cost prohibitive. He is not using any current therapy for sleep apnea.  He denies chest or neck surgery, speech or swallowing difficulties, and prior tonsillectomy. He has no history of bleeding or anesthesia complications and is not taking anticoagulants or aspirin.    Insomnia: cannot fall back asleep --- not sure; no medications; generally with regular sleep schedule (bed around 11:30, wake up around 7) Chest surgery: no Denies dysphonia, dysphagia Workup so far: no  ENT Surgery: no Personal or FHx of bleeding dz or anesthesia difficulty: no  GLP-1: prior prescribed Zepbound  but has not started it as he was waiting on FSA to start over AP/AC: no  Tobacco: no  PMHx: OSA, HTN, BP Dx, HLD, Neck Pain  Independent Review of Additional Tests or Records:  Dr. Katrinka (12/01/2023): Noted moderate OSA, CPAP can't  tolerate due to mask issues; requested zepbound  Tammy Parrett (06/26/2023): noted snoring, restless sleep; cannot wear CPAP, ref for oral appliance Home Sleep study (06/02/2023) interpreted independently: noted AHI 24.7 --- 29% central; O2 nadir 84% CBC and CMP 12/05/2023: BUN/Cr 10/0.96; WBC 6.0; Hgb 13.6  PMH/Meds/All/SocHx/FamHx/ROS:   Past Medical History:  Diagnosis Date   Anxiety    Bipolar disorder (HCC)    Depression    Heart murmur    History of skin cancer    Hypertension    Stutter      Past Surgical History:  Procedure Laterality Date   arm fx     COLONOSCOPY     HERNIA REPAIR     9 mo old   mole bx     WRIST FRACTURE SURGERY  1985, 1989    Family History  Problem Relation Age of Onset   Hyperlipidemia Mother        father   Hypertension Mother        father   Colon polyps Mother    Stomach cancer Mother    Depression Father    Melanoma Father    Lung cancer Father    Colon polyps Father    Prostate cancer Paternal Grandfather        late in life   Bipolar disorder Neg Hx    Colon cancer Neg Hx    Esophageal cancer Neg Hx    Rectal cancer Neg Hx      Social Connections: Socially Integrated (11/29/2023)   Social Connection and Isolation Panel    Frequency of Communication with Friends and Family: More than three times a week  Frequency of Social Gatherings with Friends and Family: Once a week    Attends Religious Services: More than 4 times per year    Active Member of [employer] or Organizations: Yes    Attends Banker Meetings: 1 to 4 times per year    Marital Status: Married     Current Medications[1]   Physical Exam:   BP (!) 142/91 (BP Location: Right Arm, Patient Position: Sitting, Cuff Size: Large)   Pulse 83   Ht 6' (1.829 m)   Wt 240 lb (108.9 kg)   SpO2 95%   BMI 32.55 kg/m   Salient findings:  CN II-XII intact Anterior rhinoscopy: Septum intact, dev right; bilateral inferior turbinates without significant  hypertrophy No lesions of oral cavity/oropharynx; tonsils 1/1, Tongue Friedman 3 No obviously palpable neck masses/lymphadenopathy/thyromegaly No respiratory distress or stridor  Seprately Identifiable Procedures:  Prior to initiating any procedures, risks/benefits/alternatives were explained to the patient and verbal consent obtained. Procedure Note Pre-procedure diagnosis:  Obstructive sleep apnea, rule out structural cause Post-procedure diagnosis: Same Procedure: Transnasal Fiberoptic Laryngoscopy, CPT 31575 - Mod 25 Indication: see above Complications: None apparent EBL: 0 mL  The procedure was undertaken to further evaluate the patient's complaint above, with mirror exam inadequate for appropriate examination due to gag reflex and poor patient tolerance  Procedure:  Patient was identified as correct patient. Verbal consent was obtained. The nose was sprayed with oxymetazoline and 4% lidocaine. The The flexible laryngoscope was passed through the nose to view the nasal cavity, pharynx (oropharynx, hypopharynx) and larynx.  The larynx was examined at rest and during multiple phonatory tasks. Documentation was obtained and reviewed with patient. The scope was removed. The patient tolerated the procedure well.  Findings: The nasal cavity and nasopharynx did not reveal any masses or lesions, mucosa appeared to be without obvious lesions. The tongue base, pharyngeal walls, piriform sinuses, vallecula, epiglottis and postcricoid region are normal in appearance; Muller maneuver neg. The visualized portion of the subglottis and proximal trachea is widely patent. The vocal folds are mobile bilaterally. There are no lesions on the free edge of the vocal folds nor elsewhere in the larynx worrisome for malignancy.    Impression & Plans:  Caleb Clayton is a 53 y.o. male with:  1. OSA (obstructive sleep apnea)   2. Intolerance of continuous positive airway pressure (CPAP) ventilation    We  discussed options including continued CPAP v/s Inspire. Currently not a candidate due to central events but this was on home test -- he's interested in in lab which I'll order. Also needs to trial CPAP which we will send him for sleep study and have him discuss with Pulm  We had a discussion regarding risks of Inspire including lack of benefit, persistent symptoms, pneumothorax, tongue soreness or weakness, Floor of mouth numbness, injury to major vessels, hematoma, implant infection, bleeding, scarring, tethering of neck, persistent symptoms, need for further procedures, and risk of anesthesia among others.   - f/u 3 months by phone, sooner if sleep study is done prior  See below regarding exact medications prescribed this encounter including dosages and route: No orders of the defined types were placed in this encounter.     Thank you for allowing me the opportunity to care for your patient. Please do not hesitate to contact me should you have any other questions.  Sincerely, Eldora Blanch, MD Otolaryngologist (ENT), [facility] Phone: [phone] Fax: [phone]  02/26/2024, 8:49 AM   MDM:  I have  personally spent 46 minutes involved in face-to-face and non-face-to-face activities for this patient on the day of the visit.  Professional time spent excludes any procedures performed but includes the following activities, in addition to those noted in the documentation: preparing to see the patient (review of outside documentation and results), performing a medically appropriate examination, counseling, documenting in the electronic health record, independently interpreting results (sleep study).         [1]  Current Outpatient Medications:    amLODipine  (NORVASC ) 5 MG tablet, TAKE 1 TABLET (5 MG TOTAL) BY MOUTH DAILY., Disp: 30 tablet, Rfl: 11   benztropine  (COGENTIN ) 0.5 MG tablet, TAKE 1 TABLET BY MOUTH 2 TIMES DAILY., Disp: 62 tablet, Rfl: 11   clonazePAM   (KLONOPIN ) 1 MG tablet, TAKE 1 TABLET (1 MG TOTAL) BY MOUTH AT BEDTIME AS NEEDED (DO NOT DRIVE FOR A MINIMUM OF 8 HOURS AFTER TAKING)., Disp: 30 tablet, Rfl: 5   lamoTRIgine  (LAMICTAL ) 25 MG tablet, TAKE 1 TABLET BY MOUTH TWICE A DAY, Disp: 62 tablet, Rfl: 11   losartan  (COZAAR ) 25 MG tablet, TAKE 1 TABLET (25 MG TOTAL) BY MOUTH DAILY., Disp: 30 tablet, Rfl: 5   meloxicam  (MOBIC ) 15 MG tablet, Take 1 tablet (15 mg total) by mouth daily., Disp: 10 tablet, Rfl: 0   scopolamine  (TRANSDERM-SCOP) 1 MG/3DAYS, Place 1 patch (1.5 mg total) onto the skin every 3 (three) days., Disp: 10 patch, Rfl: 12   sertraline  (ZOLOFT ) 100 MG tablet, TAKE 1 TABLET BY MOUTH EVERY DAY, Disp: 31 tablet, Rfl: 11   tirzepatide  (ZEPBOUND ) 2.5 MG/0.5ML Pen, INJECT 2.5 MG SUBCUTANEOUSLY WEEKLY, Disp: 0.5 mL, Rfl: 2   amoxicillin  (AMOXIL ) 500 MG capsule, SMARTSIG:4 Capsule(s) By Mouth (Patient not taking: Reported on 02/26/2024), Disp: , Rfl:

## 2024-03-01 DIAGNOSIS — G4733 Obstructive sleep apnea (adult) (pediatric): Secondary | ICD-10-CM

## 2024-03-04 NOTE — Telephone Encounter (Signed)
 PCCs: Please advise where we are with getting this PSG sleep study scheduled.  Thank you.

## 2024-03-04 NOTE — Telephone Encounter (Signed)
 We can't schedule for other offices. Routing to Dr.Patel and Powell so they are aware.

## 2024-03-05 NOTE — Telephone Encounter (Signed)
 Yes I will order in lab sleep study. -NPSG ordered placed, [facility] team should be in touch once scheduled.  Order for CPAP will be sent but has not been seen in the office since 05/2023 so will need to see if Insurance will cover it.  Rx sent to DME for Auto CPAP 5-15cmh2o.  Make sure he has a follow up

## 2024-03-05 NOTE — Addendum Note (Signed)
 Addended by: ORLIE MADELIN RAMAN on: 03/05/2024 03:32 PM   Modules accepted: Orders

## 2024-03-07 NOTE — Telephone Encounter (Addendum)
 Patient is scheduled for in lab sleep study on 03/05/2024 and OV on 05/03/24. Nothing further needed.

## 2024-03-26 ENCOUNTER — Other Ambulatory Visit: Payer: Self-pay | Admitting: Family Medicine

## 2024-03-26 DIAGNOSIS — I10 Essential (primary) hypertension: Secondary | ICD-10-CM

## 2024-04-29 ENCOUNTER — Ambulatory Visit (HOSPITAL_BASED_OUTPATIENT_CLINIC_OR_DEPARTMENT_OTHER): Admitting: Pulmonary Disease

## 2024-05-27 ENCOUNTER — Ambulatory Visit: Admitting: Adult Health

## 2024-05-28 ENCOUNTER — Ambulatory Visit (INDEPENDENT_AMBULATORY_CARE_PROVIDER_SITE_OTHER): Admitting: Otolaryngology

## 2024-05-31 ENCOUNTER — Ambulatory Visit: Admitting: Family Medicine

## 2024-06-04 ENCOUNTER — Ambulatory Visit: Admitting: Family Medicine

## 2024-07-01 ENCOUNTER — Encounter: Admitting: Family Medicine
